# Patient Record
Sex: Male | Born: 1955 | Race: White | Hispanic: No | Smoking: Current every day smoker
Health system: Southern US, Community
[De-identification: ages and names within clinical notes are randomized; demographics above are authoritative.]

## PROBLEM LIST (undated history)

## (undated) DIAGNOSIS — R609 Edema, unspecified: Secondary | ICD-10-CM

## (undated) DIAGNOSIS — R600 Localized edema: Secondary | ICD-10-CM

## (undated) DIAGNOSIS — J449 Chronic obstructive pulmonary disease, unspecified: Secondary | ICD-10-CM

## (undated) DIAGNOSIS — I4891 Unspecified atrial fibrillation: Secondary | ICD-10-CM

## (undated) DIAGNOSIS — L03115 Cellulitis of right lower limb: Secondary | ICD-10-CM

## (undated) DIAGNOSIS — I825Z9 Chronic embolism and thrombosis of unspecified deep veins of unspecified distal lower extremity: Secondary | ICD-10-CM

## (undated) DIAGNOSIS — E119 Type 2 diabetes mellitus without complications: Secondary | ICD-10-CM

## (undated) DIAGNOSIS — C801 Malignant (primary) neoplasm, unspecified: Secondary | ICD-10-CM

## (undated) DIAGNOSIS — I219 Acute myocardial infarction, unspecified: Secondary | ICD-10-CM

## (undated) DIAGNOSIS — J45909 Unspecified asthma, uncomplicated: Secondary | ICD-10-CM

## (undated) DIAGNOSIS — I82401 Acute embolism and thrombosis of unspecified deep veins of right lower extremity: Secondary | ICD-10-CM

## (undated) DIAGNOSIS — R319 Hematuria, unspecified: Secondary | ICD-10-CM

## (undated) HISTORY — PX: THROAT SURGERY: SHX803

## (undated) HISTORY — DX: Edema, unspecified: R60.9

## (undated) HISTORY — DX: Hematuria, unspecified: R31.9

## (undated) HISTORY — DX: Chronic obstructive pulmonary disease, unspecified: J44.9

## (undated) HISTORY — PX: LEG SURGERY: SHX1003

## (undated) HISTORY — PX: TONSILLECTOMY: SUR1361

## (undated) HISTORY — DX: Acute embolism and thrombosis of unspecified deep veins of right lower extremity: I82.401

## (undated) HISTORY — DX: Cellulitis of right lower limb: L03.115

## (undated) HISTORY — DX: Localized edema: R60.0

---

## 2004-09-07 ENCOUNTER — Ambulatory Visit: Payer: Self-pay | Admitting: Family Medicine

## 2005-04-10 ENCOUNTER — Ambulatory Visit: Payer: Self-pay | Admitting: Family Medicine

## 2008-09-01 ENCOUNTER — Encounter: Admission: RE | Admit: 2008-09-01 | Discharge: 2008-09-01 | Payer: Self-pay | Admitting: Oral Surgery

## 2011-06-26 DIAGNOSIS — I825Z9 Chronic embolism and thrombosis of unspecified deep veins of unspecified distal lower extremity: Secondary | ICD-10-CM

## 2011-06-26 HISTORY — DX: Chronic embolism and thrombosis of unspecified deep veins of unspecified distal lower extremity: I82.5Z9

## 2012-02-25 ENCOUNTER — Emergency Department (HOSPITAL_COMMUNITY)
Admission: EM | Admit: 2012-02-25 | Discharge: 2012-02-25 | Disposition: A | Discharge status: Home / Self Care | Payer: Medicaid Other | Attending: Emergency Medicine | Admitting: Emergency Medicine

## 2012-02-25 ENCOUNTER — Emergency Department (HOSPITAL_COMMUNITY): Payer: Medicaid Other

## 2012-02-25 ENCOUNTER — Encounter (HOSPITAL_COMMUNITY): Payer: Self-pay | Admitting: Emergency Medicine

## 2012-02-25 DIAGNOSIS — M7989 Other specified soft tissue disorders: Secondary | ICD-10-CM

## 2012-02-25 DIAGNOSIS — J45909 Unspecified asthma, uncomplicated: Secondary | ICD-10-CM | POA: Insufficient documentation

## 2012-02-25 DIAGNOSIS — I252 Old myocardial infarction: Secondary | ICD-10-CM | POA: Insufficient documentation

## 2012-02-25 DIAGNOSIS — I825Y9 Chronic embolism and thrombosis of unspecified deep veins of unspecified proximal lower extremity: Secondary | ICD-10-CM | POA: Insufficient documentation

## 2012-02-25 DIAGNOSIS — H9209 Otalgia, unspecified ear: Secondary | ICD-10-CM | POA: Insufficient documentation

## 2012-02-25 HISTORY — DX: Unspecified asthma, uncomplicated: J45.909

## 2012-02-25 HISTORY — DX: Malignant (primary) neoplasm, unspecified: C80.1

## 2012-02-25 HISTORY — DX: Acute myocardial infarction, unspecified: I21.9

## 2012-02-25 LAB — CBC WITH DIFFERENTIAL/PLATELET
Basophils Absolute: 0.1 10*3/uL (ref 0.0–0.1)
Basophils Relative: 1 % (ref 0–1)
Eosinophils Absolute: 0.2 10*3/uL (ref 0.0–0.7)
Eosinophils Relative: 3 % (ref 0–5)
HCT: 41 % (ref 39.0–52.0)
MCH: 32.2 pg (ref 26.0–34.0)
MCHC: 34.6 g/dL (ref 30.0–36.0)
MCV: 93 fL (ref 78.0–100.0)
Monocytes Absolute: 0.4 10*3/uL (ref 0.1–1.0)
RDW: 14.1 % (ref 11.5–15.5)

## 2012-02-25 LAB — COMPREHENSIVE METABOLIC PANEL
AST: 20 U/L (ref 0–37)
Albumin: 3.7 g/dL (ref 3.5–5.2)
CO2: 26 mEq/L (ref 19–32)
Calcium: 10 mg/dL (ref 8.4–10.5)
Creatinine, Ser: 0.75 mg/dL (ref 0.50–1.35)
GFR calc non Af Amer: >90 mL/min (ref >90)
Total Protein: 7.6 g/dL (ref 6.0–8.3)

## 2012-02-25 LAB — URINE MICROSCOPIC-ADD ON

## 2012-02-25 LAB — URINALYSIS, ROUTINE W REFLEX MICROSCOPIC
Glucose, UA: NEGATIVE mg/dL
Leukocytes, UA: NEGATIVE
Protein, ur: NEGATIVE mg/dL

## 2012-02-25 MED ORDER — IOHEXOL 350 MG/ML SOLN
100.0000 mL | Freq: Once | INTRAVENOUS | Status: AC | PRN
  Administered 2012-02-25: 100 mL via INTRAVENOUS

## 2012-02-25 NOTE — ED Notes (Signed)
Patient c/o bilaterally swelling in feet x1 week. Patient also c/o drainage coming from left ear. Reports drainage being yellow in color with foul smell.

## 2012-02-25 NOTE — ED Provider Notes (Signed)
History  This chart was scribed for No att. providers found by Shari Heritage. The patient was seen in room APA05/APA05. Patient's care was started at 0726.     CSN: [id number]  Arrival date & time 02/25/12  0708   First MD Initiated Contact with Patient 02/25/12 [phone]      Chief Complaint  Patient presents with  . Leg Swelling  . Otalgia    Patient is a 56 y.o. male presenting with leg pain. The history is provided by the patient. No language interpreter was used.  Leg Pain  The incident occurred more than 2 days ago. The incident occurred at home. There was no injury mechanism. The pain is present in the left leg and right leg. The pain has been constant since onset. It is unknown if a foreign body is present.    Keith Kelly is a 56 y.o. male who presents to the Emergency Department complaining bilateral swelling of his lower extremities onset 1 week ago. There is associated moderate, constant bilateral leg pain. He denies any obvious injury or fall. Patient states that he experiences leg swelling every year for about a month that resolves on its own, but his current pain prompted him to seek evaluation today. Patient denies abdominal pain, nausea, vomiting, fever or cough.   Patient is also complaining of left-sided otalgia. There is associated yellow drainage that is malodorous. Patient denies any right ear pain. Patient has a history of throat cancer, MI and asthma. Patient denies history of heart failure or kidney or liver problems. He is a current everyday smoker.   PCP - [place]  Past Medical History  Diagnosis Date  . Asthma   . Cancer   . MI (myocardial infarction)     Past Surgical History  Procedure Date  . Tonsillectomy   . Leg surgery   . Throat surgery     History reviewed. No pertinent family history.  History  Substance Use Topics  . Smoking status: Current Everyday Smoker -- 1.0 packs/day for 30 years    Types: Cigarettes  . Smokeless tobacco: Never  Used  . Alcohol Use: No      Review of Systems  HENT: Positive for ear pain and ear discharge.   Respiratory: Negative for cough.   Cardiovascular: Positive for leg swelling.  Gastrointestinal: Negative for nausea, vomiting and abdominal pain.  All other systems reviewed and are negative.    Allergies  Penicillins  Home Medications   Current Outpatient Rx  Name Route Sig Dispense Refill  . ASPIRIN EC 325 MG PO TBEC Oral Take 325 mg by mouth daily.    . BUDESONIDE-FORMOTEROL FUMARATE 160-4.5 MCG/ACT IN AERO Inhalation Inhale 2 puffs into the lungs 2 (two) times daily.      BP 122/70  Pulse 62  Temp 97.9 F (36.6 C) (Oral)  Resp 20  Ht 6\' 3"  (1.905 m)  Wt 280 lb (127.007 kg)  BMI 35.00 kg/m2  SpO2 95%  Physical Exam  Constitutional: He is oriented to person, place, and time. He appears well-developed and well-nourished.  HENT:  Head: Normocephalic and atraumatic.       Moderate cerumen bilaterally, left TM appears normal. No tragus or mastoid pain.  Eyes: EOM are normal. Pupils are equal, round, and reactive to light.  Cardiovascular: Normal rate and regular rhythm.   Pulses:      Dorsalis pedis pulses are 2+ on the right side, and 2+ on the left side.  Pulmonary/Chest: Effort normal. No  respiratory distress.  Musculoskeletal: Normal range of motion. He exhibits edema (+3 pitting edema to mid tibia bilaterally).       Chronic venous stasis changes of the bilateral shins. +2 DP pulses. Ankle plantar dorsiflexion intact. Distal sensation intact. No palpable cords.  Neurological: He is alert and oriented to person, place, and time.  Skin: Skin is warm and dry.  Psychiatric: He has a normal mood and affect. His behavior is normal.    ED Course  Procedures (including critical care time) DIAGNOSTIC STUDIES: Oxygen Saturation is 98% on room air, normal by my interpretation.    COORDINATION OF CARE: 7:39am- Patient informed of current plan for treatment and evaluation  and agrees with plan at this time. Ordered US of bilateral lower extremities, microscopic urine test, CBC, comp met pnael, BNP test. Also ordered cerumen removal.  9:20am- Patient is stable. Waiting on [place].  11:25am- Advised patient on [place] results, no blood clots visible to lungs.   Labs Reviewed  COMPREHENSIVE METABOLIC PANEL - Abnormal; Notable for the following:    Sodium 132 (*)     Glucose, Bld 111 (*)     Total Bilirubin 0.2 (*)     All other components within normal limits  URINALYSIS, ROUTINE W REFLEX MICROSCOPIC - Abnormal; Notable for the following:    Hgb urine dipstick TRACE (*)     All other components within normal limits  CBC WITH DIFFERENTIAL  PRO B NATRIURETIC PEPTIDE  URINE MICROSCOPIC-ADD ON    Ct Angio Chest W/cm &/or Wo Cm  02/25/2012  *RADIOLOGY REPORT*  Clinical Data: Chronic deep venous thrombosis.  Pulmonary embolism. Leg swelling.  CT ANGIOGRAPHY CHEST  Technique:  Multidetector CT imaging of the chest using the standard protocol during bolus administration of intravenous contrast. Multiplanar reconstructed images including MIPs were obtained and reviewed to evaluate the vascular anatomy.  Contrast: OMNIPAQUE IOHEXOL 350 MG/ML SOLN  Comparison: 05/26/2005 chest CT.  Findings: Technically adequate study.  Negative for pulmonary embolus.  There is there is a linear area of low attenuation in the right lower lobe pulmonary artery (image number 59) which may be artifactual due to contrast within the venous system or represent old pulmonary embolus with web.  Chronic enlargement of the left pulmonary artery, similar to 2006. Incidental imaging the upper abdomen demonstrates a partially visualized 24 mm right adrenal nodule.  Although only partially visualized, this probably represents adrenal adenoma which was seen on the prior exam.  Mild aortic atherosclerosis.  Three-vessel aortic arch.  Stable scattered peripheral pulmonary nodules measuring 4 mm or less.  Central  airways are patent.  No aggressive osseous lesions.  IMPRESSION: 1.  Negative for pulmonary embolus. 2.  Partially visualized right adrenal nodule, probably adenoma. 3.  Stable benign scattered 4 mm or less pulmonary nodules.   Original Report Authenticated By: Andreas Newport, M.D.    US Venous Img Lower Bilateral  02/25/2012  *RADIOLOGY REPORT*  Clinical Data: Bilateral lower extremity edema and pain.  History of DVT.  VENOUS DUPLEX ULTRASOUND OF BILATERAL LOWER EXTREMITIES  Technique:  Gray-scale sonography with graded compression, as well as color Doppler and duplex ultrasound, were performed to evaluate the deep venous system of both lower extremities from the level of the common femoral vein through the popliteal and proximal calf veins.  Spectral Doppler was utilized to evaluate flow at rest and with distal augmentation maneuvers.  Comparison:  Recent study performed at [facility] on 02/23/2012.  Findings: Similar to the recent study  performed at [facility], there is evidence of nonocclusive mixed density mural thrombus in the distal femoral vein and popliteal veins of the right lower extremity with appearance suggestive of chronic thrombus.  No evidence of progression since the prior ultrasound and no evidence of occlusive DVT.  Similar nonocclusive thrombus is noted in a proximal left posterior tibial vein.  There is a small amount of nonocclusive thrombus in the distal thigh at the level of the distal left femoral vein. This appears likely chronic.  IMPRESSION: Similar findings to the recent [facility] study demonstrating evidence of probable chronic DVT in both lower extremities.  No evidence of significant propagation of thrombus since the prior study.   Original Report Authenticated By: Reola Calkins, M.D.      1. Leg swelling       MDM  Bilateral atraumatic lower extremity edema for the past week intermittent for the past one year. No chest pain or shortness  of breath.  Normal renal and hepatic function. BNP normal.  Venous Doppler showed bilateral chronic DVTs.  Patient now remembers that he had his ultrasound done several days ago at [facility].  He denies any chest pain or shortness of breath. However given his chronic DVTs, we'll evaluate for pulmonary embolism.  CT negative for pulmonary embolism.  No indication for anticoagulation in absence of acute thromboembolism.   I personally performed the services described in this documentation, which was scribed in my presence.  The recorded information has been reviewed and considered.    Glynn Octave, MD 02/25/12 1730

## 2012-02-25 NOTE — ED Notes (Signed)
Patient with no complaints at this time. Respirations even and unlabored. Skin warm/dry. Discharge instructions reviewed with patient at this time. Patient given opportunity to voice concerns/ask questions. Patient discharged at this time and left Emergency Department with steady gait.   

## 2014-04-04 ENCOUNTER — Emergency Department (HOSPITAL_COMMUNITY)
Admission: EM | Admit: 2014-04-04 | Discharge: 2014-04-04 | Disposition: A | Discharge status: Home / Self Care | Payer: Medicaid Other | Attending: Emergency Medicine | Admitting: Emergency Medicine

## 2014-04-04 ENCOUNTER — Encounter (HOSPITAL_COMMUNITY): Payer: Self-pay | Admitting: Emergency Medicine

## 2014-04-04 DIAGNOSIS — Z86718 Personal history of other venous thrombosis and embolism: Secondary | ICD-10-CM | POA: Diagnosis not present

## 2014-04-04 DIAGNOSIS — Z9889 Other specified postprocedural states: Secondary | ICD-10-CM | POA: Diagnosis not present

## 2014-04-04 DIAGNOSIS — I252 Old myocardial infarction: Secondary | ICD-10-CM | POA: Insufficient documentation

## 2014-04-04 DIAGNOSIS — Z859 Personal history of malignant neoplasm, unspecified: Secondary | ICD-10-CM | POA: Insufficient documentation

## 2014-04-04 DIAGNOSIS — Z79899 Other long term (current) drug therapy: Secondary | ICD-10-CM | POA: Insufficient documentation

## 2014-04-04 DIAGNOSIS — J45909 Unspecified asthma, uncomplicated: Secondary | ICD-10-CM | POA: Diagnosis not present

## 2014-04-04 DIAGNOSIS — M7989 Other specified soft tissue disorders: Secondary | ICD-10-CM | POA: Diagnosis present

## 2014-04-04 DIAGNOSIS — Z880 Allergy status to penicillin: Secondary | ICD-10-CM | POA: Insufficient documentation

## 2014-04-04 DIAGNOSIS — R600 Localized edema: Secondary | ICD-10-CM | POA: Diagnosis not present

## 2014-04-04 DIAGNOSIS — Z720 Tobacco use: Secondary | ICD-10-CM | POA: Diagnosis not present

## 2014-04-04 DIAGNOSIS — Z7982 Long term (current) use of aspirin: Secondary | ICD-10-CM | POA: Insufficient documentation

## 2014-04-04 HISTORY — DX: Chronic embolism and thrombosis of unspecified deep veins of unspecified distal lower extremity: I82.5Z9

## 2014-04-04 MED ORDER — ENOXAPARIN SODIUM 120 MG/0.8ML ~~LOC~~ SOLN
1.0000 mg/kg | Freq: Once | SUBCUTANEOUS | Status: AC
  Administered 2014-04-04: 105 mg via SUBCUTANEOUS
  Filled 2014-04-04: qty 0.8

## 2014-04-04 NOTE — Discharge Instructions (Signed)
Return tomorrow at the given time for an ultrasound of your leg to rule out a blood clot.  Return to the emergency department if you develop chest pain or difficulty breathing.   Edema Edema is an abnormal buildup of fluids in your bodytissues. Edema is somewhatdependent on gravity to pull the fluid to the lowest place in your body. That makes the condition more common in the legs and thighs (lower extremities). Painless swelling of the feet and ankles is common and becomes more likely as you get older. It is also common in looser tissues, like around your eyes.  When the affected area is squeezed, the fluid may move out of that spot and leave a dent for a few moments. This dent is called pitting.  CAUSES  There are many possible causes of edema. Eating too much salt and being on your feet or sitting for a long time can cause edema in your legs and ankles. Hot weather may make edema worse. Common medical causes of edema include:  Heart failure.  Liver disease.  Kidney disease.  Weak blood vessels in your legs.  Cancer.  An injury.  Pregnancy.  Some medications.  Obesity. SYMPTOMS  Edema is usually painless.Your skin may look swollen or shiny.  DIAGNOSIS  Your health care provider may be able to diagnose edema by asking about your medical history and doing a physical exam. You may need to have tests such as X-rays, an electrocardiogram, or blood tests to check for medical conditions that may cause edema.  TREATMENT  Edema treatment depends on the cause. If you have heart, liver, or kidney disease, you need the treatment appropriate for these conditions. General treatment may include:  Elevation of the affected body part above the level of your heart.  Compression of the affected body part. Pressure from elastic bandages or support stockings squeezes the tissues and forces fluid back into the blood vessels. This keeps fluid from entering the tissues.  Restriction of fluid and  salt intake.  Use of a water pill (diuretic). These medications are appropriate only for some types of edema. They pull fluid out of your body and make you urinate more often. This gets rid of fluid and reduces swelling, but diuretics can have side effects. Only use diuretics as directed by your health care provider. HOME CARE INSTRUCTIONS   Keep the affected body part above the level of your heart when you are lying down.   Do not sit still or stand for prolonged periods.   Do not put anything directly under your knees when lying down.  Do not wear constricting clothing or garters on your upper legs.   Exercise your legs to work the fluid back into your blood vessels. This may help the swelling go down.   Wear elastic bandages or support stockings to reduce ankle swelling as directed by your health care provider.   Eat a low-salt diet to reduce fluid if your health care provider recommends it.   Only take medicines as directed by your health care provider. SEEK MEDICAL CARE IF:   Your edema is not responding to treatment.  You have heart, liver, or kidney disease and notice symptoms of edema.  You have edema in your legs that does not improve after elevating them.   You have sudden and unexplained weight gain. SEEK IMMEDIATE MEDICAL CARE IF:   You develop shortness of breath or chest pain.   You cannot breathe when you lie down.  You develop pain,  redness, or warmth in the swollen areas.   You have heart, liver, or kidney disease and suddenly get edema.  You have a fever and your symptoms suddenly get worse. MAKE SURE YOU:   Understand these instructions.  Will watch your condition.  Will get help right away if you are not doing well or get worse. Document Released: 06/11/2005 Document Revised: 10/26/2013 Document Reviewed: 04/03/2013 [facility] Patient Information 2015 [place], [place]. This information is not intended to replace advice given to you by your  health care provider. Make sure you discuss any questions you have with your health care provider.

## 2014-04-04 NOTE — ED Provider Notes (Signed)
CSN: [id number]     Arrival date & time 04/04/14  1429 History  This chart was scribed for Veryl Speak, MD by Zola Button, ED Scribe. This patient was seen in room APA12/APA12 and the patient's care was started at 3:18 PM.     Chief Complaint  Patient presents with  . Leg Swelling      Patient is a 58 y.o. male presenting with leg pain. The history is provided by the patient. No language interpreter was used.  Leg Pain Location:  Leg Time since incident:  1 day Injury: no   Leg location:  R leg Pain details:    Onset quality:  Gradual   Duration:  1 day Dislocation: no   Foreign body present:  No foreign bodies Worsened by:  Activity Associated symptoms: swelling   Associated symptoms: no fever    HPI Comments: MONTAY VANVOORHIS is a 58 y.o. male who presents to the Emergency Department complaining of right leg swelling that began yesterday. He notes associated pain towards the back of his right calf. Patient has experienced difficulty ambulating due to his symptoms. Patient denies similar episodes previously. He deneis CP, SOB, and injury.  Patient has no hx of DM. He is currently taking fluid pill, potassium and pain medication.  PCP: Lars Mage (?)    Past Medical History  Diagnosis Date  . Asthma   . Cancer   . MI (myocardial infarction)   . Lower leg DVT (deep venous thromboembolism), chronic 2013    chronic   Past Surgical History  Procedure Laterality Date  . Tonsillectomy    . Leg surgery    . Throat surgery     History reviewed. No pertinent family history. History  Substance Use Topics  . Smoking status: Current Every Day Smoker -- 0.50 packs/day for 30 years    Types: Cigarettes  . Smokeless tobacco: Never Used  . Alcohol Use: No    Review of Systems  Constitutional: Negative for fever and chills.  Musculoskeletal: Positive for arthralgias and joint swelling.  All other systems reviewed and are negative.     Allergies   Penicillins  Home Medications   Prior to Admission medications   Medication Sig Start Date End Date Taking? Authorizing Provider  aspirin EC 325 MG tablet Take 325 mg by mouth daily.    Historical Provider, MD  budesonide-formoterol (SYMBICORT) 160-4.5 MCG/ACT inhaler Inhale 2 puffs into the lungs 2 (two) times daily.    Historical Provider, MD   BP 137/68  Pulse 96  Temp(Src) 97.8 F (36.6 C) (Oral)  Resp 20  Ht 6\' 3"  (1.905 m)  Wt 236 lb (107.049 kg)  BMI 29.50 kg/m2  SpO2 96% Physical Exam  Nursing note and vitals reviewed. Constitutional: He is oriented to person, place, and time. He appears well-developed and well-nourished. No distress.  HENT:  Head: Normocephalic and atraumatic.  Mouth/Throat: Oropharynx is clear and moist. No oropharyngeal exudate.  Eyes: Pupils are equal, round, and reactive to light.  Neck: Neck supple.  Cardiovascular: Normal rate.   Pulmonary/Chest: Effort normal.  Musculoskeletal: He exhibits edema.  Right lower leg is noted to have significant swelling out of proportion to the left leg. There is tenderness in the right calf, however homan sign is absent.  Neurological: He is alert and oriented to person, place, and time. No cranial nerve deficit.  Skin: Skin is warm and dry. No rash noted.  Psychiatric: He has a normal mood and affect. His behavior is  normal.    ED Course  Procedures  DIAGNOSTIC STUDIES: Oxygen Saturation is 96% on RA, adequate by my interpretation.    COORDINATION OF CARE: 3:21 PM-Discussed treatment plan which includes medication with pt at bedside and pt agreed to plan.   Labs Review Labs Reviewed - No data to display  Imaging Review No results found.   EKG Interpretation None      MDM   Final diagnoses:  None    Patient presents with complaints of swelling and pain in his right lower extremity concerning for a DVT. He has no history of this and is not taking any blood thinners with the exception of  aspirin. He was given a subcutaneous injection of Lovenox and will return tomorrow for an ultrasound to rule out DVT.  I personally performed the services described in this documentation, which was scribed in my presence. The recorded information has been reviewed and is accurate.      Veryl Speak, MD 04/05/14 0001

## 2014-04-04 NOTE — ED Notes (Signed)
Pt reports leg swelling RLE, increased urinary frequency since this am. Pt alert and oriented. Limited ROM noted in RLE. Pt denies any SOB,CP. Equal warmth noted in BLE. Moderate Edema noted to RLE.

## 2014-04-05 ENCOUNTER — Other Ambulatory Visit (HOSPITAL_COMMUNITY): Payer: Self-pay | Admitting: Emergency Medicine

## 2014-04-05 DIAGNOSIS — R609 Edema, unspecified: Secondary | ICD-10-CM

## 2014-04-06 ENCOUNTER — Ambulatory Visit (HOSPITAL_COMMUNITY)
Admission: RE | Admit: 2014-04-06 | Discharge: 2014-04-06 | Disposition: A | Discharge status: Home / Self Care | Payer: Medicaid Other | Source: Ambulatory Visit | Attending: Emergency Medicine | Admitting: Emergency Medicine

## 2014-04-06 DIAGNOSIS — Z86718 Personal history of other venous thrombosis and embolism: Secondary | ICD-10-CM | POA: Insufficient documentation

## 2014-04-06 DIAGNOSIS — R609 Edema, unspecified: Secondary | ICD-10-CM | POA: Diagnosis present

## 2014-04-06 DIAGNOSIS — M79604 Pain in right leg: Secondary | ICD-10-CM | POA: Insufficient documentation

## 2014-04-10 NOTE — ED Provider Notes (Signed)
Ultrasound right lower extremity reveals a DVT.  No chest pain or shortness of breath. Discussed with patient and his wife. Will start Xarelto. Patient has primary care followup.  Nat Christen, MD 04/10/14 1723

## 2014-05-06 ENCOUNTER — Ambulatory Visit: Payer: Medicaid Other | Admitting: Cardiovascular Disease

## 2014-05-06 ENCOUNTER — Ambulatory Visit (INDEPENDENT_AMBULATORY_CARE_PROVIDER_SITE_OTHER): Payer: Medicaid Other | Admitting: Cardiovascular Disease

## 2014-05-06 ENCOUNTER — Encounter: Payer: Self-pay | Admitting: Cardiovascular Disease

## 2014-05-06 DIAGNOSIS — C140 Malignant neoplasm of pharynx, unspecified: Secondary | ICD-10-CM | POA: Insufficient documentation

## 2014-05-06 DIAGNOSIS — R0602 Shortness of breath: Secondary | ICD-10-CM

## 2014-05-06 DIAGNOSIS — I82409 Acute embolism and thrombosis of unspecified deep veins of unspecified lower extremity: Secondary | ICD-10-CM | POA: Insufficient documentation

## 2014-05-06 DIAGNOSIS — J449 Chronic obstructive pulmonary disease, unspecified: Secondary | ICD-10-CM

## 2014-05-06 DIAGNOSIS — I517 Cardiomegaly: Secondary | ICD-10-CM | POA: Insufficient documentation

## 2014-05-06 DIAGNOSIS — I82401 Acute embolism and thrombosis of unspecified deep veins of right lower extremity: Secondary | ICD-10-CM

## 2014-05-06 DIAGNOSIS — R9431 Abnormal electrocardiogram [ECG] [EKG]: Secondary | ICD-10-CM | POA: Insufficient documentation

## 2014-05-06 DIAGNOSIS — R600 Localized edema: Secondary | ICD-10-CM

## 2014-05-06 NOTE — Progress Notes (Signed)
[name] Date of Birth  1956-06-18       [place] [address], [address], [place], [place]  [address]   [place],   [id number] [place]   Fax  [phone]     Fax [phone]  Problem List: 1. Throat  Cancer ~ 2010 2. COPD 3. LVH by EKG 4. Right leg DVT - dx 2015   History of Present Illness:  Keith Kelly is seen today for evaluation for abnormal ECG - LVH Hx COPD. He has frequent episodes of shortness of breath. He denies any chest discomfort. Does not do any yard work.  Does not do any exercise.  May walk to his daughter's house several times a day.  No CP with walking.   Still smokes  Hx of throat cancer.  Has had tonsils and part of his lip removed.   He developed pain and swelling in his right leg and was found to have a DVT about a month ago.   Does not follow any special diet.  Does his own cooking-   Current Outpatient Prescriptions on File Prior to Visit  Medication Sig Dispense Refill  . budesonide-formoterol (SYMBICORT) 160-4.5 MCG/ACT inhaler Inhale 2 puffs into the lungs 2 (two) times daily.    Marland Kitchen etodolac (LODINE) 400 MG tablet Take 400 mg by mouth daily as needed for mild pain or moderate pain.    . furosemide (LASIX) 40 MG tablet Take 40 mg by mouth daily.    Marland Kitchen HYDROcodone-acetaminophen (NORCO/VICODIN) 5-325 MG per tablet Take 1-2 tablets by mouth every 6 (six) hours as needed for moderate pain.    . potassium chloride (K-DUR) 10 MEQ tablet Take 10 mEq by mouth daily.     No current facility-administered medications on file prior to visit.    Allergies  Allergen Reactions  . Meloxicam Other (See Comments)    UNKNOWN  . Penicillins Hives    Past Medical History  Diagnosis Date  . Asthma   . Cancer   . MI (myocardial infarction)   . Lower leg DVT (deep venous thromboembolism), chronic 2013    chronic  . Cellulitis of right lower extremity   . Blood  in urine   . Right leg DVT   . Peripheral edema   . COPD (chronic obstructive pulmonary disease)     Past Surgical History  Procedure Laterality Date  . Tonsillectomy    . Leg surgery    . Throat surgery      History  Smoking status  . Current Every Day Smoker -- 0.50 packs/day for 30 years  . Types: Cigarettes  Smokeless tobacco  . Never Used    History  Alcohol Use No    Family History  Problem Relation Age of Onset  . COPD Father   . Deep vein thrombosis Mother     Reviw of Systems:  Reviewed in the HPI.  All other systems are negative.  Physical Exam: Blood pressure 116/82, pulse 82, height 6\' 3"  (1.905 m), weight 279 lb (126.554 kg). Wt Readings from Last 3 Encounters:  05/06/14 279 lb (126.554 kg)  04/04/14 236 lb (107.049 kg)  02/25/12 280 lb (127.007 kg)     General: Well developed, well nourished, in no acute distress.  Head: Normocephalic, s/p left face surgery sclera non-icteric, mucus membranes are moist,   Neck: Supple. Carotids are 2 + without bruits. No  JVD  Lungs: Clear   Heart: RR, normal s1s2  Abdomen: Soft, non-tender, obese   Msk:  Strength and tone are normal   Extremities: No clubbing or cyanosis. 2+ leg edema.   Chronic stasis changes   Neuro: CN II - XII intact.  Alert and oriented X 3.   Psych:  Flat affect.   ECG: 05/06/2014: Normal sinus rhythm at 82. He has left axis deviation. There is minimal voltage criteria for left ventricular hypertrophy.  Assessment / Plan:

## 2014-05-06 NOTE — Patient Instructions (Addendum)
  I recommend that you elevate your legs .   Go to [email or web address]      REDUCE HIGH SODIUM FOODS LIKE CANNED SOUP, GRAVY, SAUCES, READY PREPARED FOODS LIKE FROZEN FOODS; LEAN CUISINE, LASAGNA. BACON, SAUSAGE, LUNCH MEAT, FAST FOODS, HOT DOGS, CHIPS, PIZZA.   Your physician has requested that you have an echocardiogram. Echocardiography is a painless test that uses sound waves to create images of your heart. It provides your doctor with information about the size and shape of your heart and how well your heart's chambers and valves are working. This procedure takes approximately one hour. There are no restrictions for this procedure.  Your physician recommends that you continue on your current medications as directed. Please refer to the Current Medication list given to you today.   Your physician wants you to follow-up in: 6 months with Dr. Acie Fredrickson.  You will receive a reminder letter in the mail two months in advance. If you don't receive a letter, please call our office to schedule the follow-up appointment.

## 2014-05-06 NOTE — Assessment & Plan Note (Signed)
Continue xarelto Follow up with his medical doctor  Recommended leg elevation

## 2014-05-06 NOTE — Assessment & Plan Note (Signed)
We're asked to see Mr. 6 today for further evaluation of an abnormal EKG. He was noted to have left ventricular hypertrophy on his EKG. He has chronic obstructive pulmonary disease and is chronically short. He denies episodes of chest pain. His blood pressure is normal today.  I would like to get an echocardiogram for further evaluation of his mild leg edema and his LVH by EKG. I'm concerned that he may have some congestive heart failure.  We discussed a low-salt diet. Discussed the importance of leg elevation. I've given him a website for the lounge Dr. Leg rest. I will see him  back in 6 months. If he seems to be stable then we will refer him back to his primary medical doctor.  At this point he does not have any signs or symptoms of coronary ischemia. I did recommend that he stop smoking completely. He has a history of throat cancer

## 2014-05-12 ENCOUNTER — Other Ambulatory Visit (HOSPITAL_COMMUNITY): Payer: Medicaid Other

## 2015-04-01 ENCOUNTER — Other Ambulatory Visit (HOSPITAL_COMMUNITY): Payer: Self-pay | Admitting: Respiratory Therapy

## 2015-04-01 DIAGNOSIS — G4730 Sleep apnea, unspecified: Secondary | ICD-10-CM

## 2016-12-04 ENCOUNTER — Encounter (HOSPITAL_COMMUNITY): Payer: Self-pay

## 2016-12-04 ENCOUNTER — Emergency Department (HOSPITAL_COMMUNITY): Payer: Medicaid Other

## 2016-12-04 ENCOUNTER — Inpatient Hospital Stay (HOSPITAL_COMMUNITY)
Admission: EM | Admit: 2016-12-04 | Discharge: 2016-12-05 | DRG: 292 | Discharge status: Against Medical Advice | Payer: Medicaid Other | Attending: Family Medicine | Admitting: Family Medicine

## 2016-12-04 DIAGNOSIS — E1165 Type 2 diabetes mellitus with hyperglycemia: Secondary | ICD-10-CM | POA: Diagnosis present

## 2016-12-04 DIAGNOSIS — Z85819 Personal history of malignant neoplasm of unspecified site of lip, oral cavity, and pharynx: Secondary | ICD-10-CM | POA: Diagnosis not present

## 2016-12-04 DIAGNOSIS — H9190 Unspecified hearing loss, unspecified ear: Secondary | ICD-10-CM | POA: Diagnosis present

## 2016-12-04 DIAGNOSIS — F17210 Nicotine dependence, cigarettes, uncomplicated: Secondary | ICD-10-CM | POA: Diagnosis present

## 2016-12-04 DIAGNOSIS — I252 Old myocardial infarction: Secondary | ICD-10-CM | POA: Diagnosis not present

## 2016-12-04 DIAGNOSIS — Z880 Allergy status to penicillin: Secondary | ICD-10-CM

## 2016-12-04 DIAGNOSIS — J449 Chronic obstructive pulmonary disease, unspecified: Secondary | ICD-10-CM | POA: Diagnosis present

## 2016-12-04 DIAGNOSIS — L03115 Cellulitis of right lower limb: Secondary | ICD-10-CM | POA: Diagnosis present

## 2016-12-04 DIAGNOSIS — L8990 Pressure ulcer of unspecified site, unspecified stage: Secondary | ICD-10-CM | POA: Insufficient documentation

## 2016-12-04 DIAGNOSIS — I4891 Unspecified atrial fibrillation: Secondary | ICD-10-CM | POA: Diagnosis present

## 2016-12-04 DIAGNOSIS — Z888 Allergy status to other drugs, medicaments and biological substances status: Secondary | ICD-10-CM | POA: Diagnosis not present

## 2016-12-04 DIAGNOSIS — Z5321 Procedure and treatment not carried out due to patient leaving prior to being seen by health care provider: Secondary | ICD-10-CM | POA: Diagnosis not present

## 2016-12-04 DIAGNOSIS — L03116 Cellulitis of left lower limb: Secondary | ICD-10-CM | POA: Diagnosis present

## 2016-12-04 DIAGNOSIS — Z6841 Body Mass Index (BMI) 40.0 and over, adult: Secondary | ICD-10-CM | POA: Diagnosis not present

## 2016-12-04 DIAGNOSIS — L03311 Cellulitis of abdominal wall: Secondary | ICD-10-CM | POA: Diagnosis present

## 2016-12-04 DIAGNOSIS — E119 Type 2 diabetes mellitus without complications: Secondary | ICD-10-CM

## 2016-12-04 DIAGNOSIS — Z7984 Long term (current) use of oral hypoglycemic drugs: Secondary | ICD-10-CM | POA: Diagnosis not present

## 2016-12-04 DIAGNOSIS — I82431 Acute embolism and thrombosis of right popliteal vein: Secondary | ICD-10-CM | POA: Diagnosis present

## 2016-12-04 DIAGNOSIS — I509 Heart failure, unspecified: Secondary | ICD-10-CM

## 2016-12-04 DIAGNOSIS — Z79899 Other long term (current) drug therapy: Secondary | ICD-10-CM

## 2016-12-04 DIAGNOSIS — Z7901 Long term (current) use of anticoagulants: Secondary | ICD-10-CM | POA: Diagnosis not present

## 2016-12-04 DIAGNOSIS — D509 Iron deficiency anemia, unspecified: Secondary | ICD-10-CM | POA: Diagnosis present

## 2016-12-04 DIAGNOSIS — E118 Type 2 diabetes mellitus with unspecified complications: Secondary | ICD-10-CM | POA: Diagnosis not present

## 2016-12-04 DIAGNOSIS — I5023 Acute on chronic systolic (congestive) heart failure: Secondary | ICD-10-CM | POA: Diagnosis not present

## 2016-12-04 DIAGNOSIS — I82409 Acute embolism and thrombosis of unspecified deep veins of unspecified lower extremity: Secondary | ICD-10-CM

## 2016-12-04 DIAGNOSIS — Z86718 Personal history of other venous thrombosis and embolism: Secondary | ICD-10-CM

## 2016-12-04 HISTORY — DX: Unspecified atrial fibrillation: I48.91

## 2016-12-04 HISTORY — DX: Type 2 diabetes mellitus without complications: E11.9

## 2016-12-04 LAB — CBC WITH DIFFERENTIAL/PLATELET
Basophils Absolute: 0.1 10*3/uL (ref 0.0–0.1)
Basophils Relative: 1 %
EOS PCT: 1 %
Eosinophils Absolute: 0.1 10*3/uL (ref 0.0–0.7)
HEMATOCRIT: 39 % (ref 39.0–52.0)
Hemoglobin: 11.6 g/dL — ABNORMAL LOW (ref 13.0–17.0)
Lymphocytes Relative: 15 %
Lymphs Abs: 1.3 10*3/uL (ref 0.7–4.0)
MCH: 23 pg — ABNORMAL LOW (ref 26.0–34.0)
MCHC: 29.7 g/dL — AB (ref 30.0–36.0)
MCV: 77.4 fL — ABNORMAL LOW (ref 78.0–100.0)
MONO ABS: 0.8 10*3/uL (ref 0.1–1.0)
MONOS PCT: 9 %
NEUTROS ABS: 6.6 10*3/uL (ref 1.7–7.7)
Neutrophils Relative %: 74 %
PLATELETS: 218 10*3/uL (ref 150–400)
RBC: 5.04 MIL/uL (ref 4.22–5.81)
RDW: 18.1 % — AB (ref 11.5–15.5)
WBC: 8.9 10*3/uL (ref 4.0–10.5)

## 2016-12-04 LAB — I-STAT CG4 LACTIC ACID, ED: Lactic Acid, Venous: 1.98 mmol/L (ref 0.5–1.9)

## 2016-12-04 LAB — URINALYSIS, ROUTINE W REFLEX MICROSCOPIC
Bilirubin Urine: NEGATIVE
Glucose, UA: NEGATIVE mg/dL
Hgb urine dipstick: NEGATIVE
KETONES UR: NEGATIVE mg/dL
Leukocytes, UA: NEGATIVE
NITRITE: NEGATIVE
Protein, ur: NEGATIVE mg/dL
SPECIFIC GRAVITY, URINE: 1.003 — AB (ref 1.005–1.030)
pH: 5 (ref 5.0–8.0)

## 2016-12-04 LAB — COMPREHENSIVE METABOLIC PANEL
ALBUMIN: 3.2 g/dL — AB (ref 3.5–5.0)
ALK PHOS: 123 U/L (ref 38–126)
ALT: 12 U/L — AB (ref 17–63)
AST: 17 U/L (ref 15–41)
Anion gap: 9 (ref 5–15)
BUN: 17 mg/dL (ref 6–20)
CO2: 29 mmol/L (ref 22–32)
CREATININE: 0.84 mg/dL (ref 0.61–1.24)
Calcium: 8.6 mg/dL — ABNORMAL LOW (ref 8.9–10.3)
Chloride: 95 mmol/L — ABNORMAL LOW (ref 101–111)
GFR calc Af Amer: >60 mL/min (ref >60)
GFR calc non Af Amer: >60 mL/min (ref >60)
GLUCOSE: 158 mg/dL — AB (ref 65–99)
Potassium: 4.3 mmol/L (ref 3.5–5.1)
SODIUM: 133 mmol/L — AB (ref 135–145)
Total Bilirubin: 0.7 mg/dL (ref 0.3–1.2)
Total Protein: 7.8 g/dL (ref 6.5–8.1)

## 2016-12-04 LAB — GLUCOSE, CAPILLARY: Glucose-Capillary: 135 mg/dL — ABNORMAL HIGH (ref 65–99)

## 2016-12-04 LAB — BRAIN NATRIURETIC PEPTIDE: B NATRIURETIC PEPTIDE 5: 345 pg/mL — AB (ref 0.0–100.0)

## 2016-12-04 MED ORDER — VANCOMYCIN HCL IN DEXTROSE 1-5 GM/200ML-% IV SOLN: 1000.0000 mg | Freq: Once | INTRAVENOUS | Status: DC

## 2016-12-04 MED ORDER — MOMETASONE FURO-FORMOTEROL FUM 200-5 MCG/ACT IN AERO
2.0000 | INHALATION_SPRAY | Freq: Two times a day (BID) | RESPIRATORY_TRACT | Status: DC
  Administered 2016-12-04 – 2016-12-05 (×2): 2 via RESPIRATORY_TRACT
  Filled 2016-12-04: qty 8.8

## 2016-12-04 MED ORDER — AZTREONAM 2 G IJ SOLR
2.0000 g | Freq: Once | INTRAMUSCULAR | Status: AC
  Administered 2016-12-04: 2 g via INTRAVENOUS
  Filled 2016-12-04: qty 2

## 2016-12-04 MED ORDER — ATORVASTATIN CALCIUM 20 MG PO TABS
20.0000 mg | ORAL_TABLET | Freq: Every day | ORAL | Status: DC
  Administered 2016-12-04: 20 mg via ORAL
  Filled 2016-12-04 (×2): qty 1

## 2016-12-04 MED ORDER — METRONIDAZOLE IN NACL 5-0.79 MG/ML-% IV SOLN
500.0000 mg | Freq: Once | INTRAVENOUS | Status: AC
  Administered 2016-12-04: 500 mg via INTRAVENOUS
  Filled 2016-12-04: qty 100

## 2016-12-04 MED ORDER — SODIUM CHLORIDE 0.9% FLUSH: 3.0000 mL | INTRAVENOUS | Status: DC | PRN

## 2016-12-04 MED ORDER — INSULIN ASPART 100 UNIT/ML ~~LOC~~ SOLN: 0.0000 [IU] | Freq: Every day | SUBCUTANEOUS | Status: DC

## 2016-12-04 MED ORDER — FUROSEMIDE 10 MG/ML IJ SOLN
40.0000 mg | Freq: Two times a day (BID) | INTRAMUSCULAR | Status: DC
  Administered 2016-12-04 – 2016-12-05 (×2): 40 mg via INTRAVENOUS
  Filled 2016-12-04 (×2): qty 4

## 2016-12-04 MED ORDER — HYDROCODONE-ACETAMINOPHEN 5-325 MG PO TABS
1.0000 | ORAL_TABLET | Freq: Four times a day (QID) | ORAL | Status: DC | PRN
  Administered 2016-12-05: 2 via ORAL
  Administered 2016-12-05: 1 via ORAL
  Filled 2016-12-04 (×2): qty 2

## 2016-12-04 MED ORDER — NICOTINE 14 MG/24HR TD PT24
14.0000 mg | MEDICATED_PATCH | Freq: Every day | TRANSDERMAL | Status: DC
  Administered 2016-12-04 – 2016-12-05 (×2): 14 mg via TRANSDERMAL
  Filled 2016-12-04 (×2): qty 1

## 2016-12-04 MED ORDER — FENTANYL CITRATE (PF) 100 MCG/2ML IJ SOLN
100.0000 ug | Freq: Once | INTRAMUSCULAR | Status: AC
  Administered 2016-12-04: 100 ug via INTRAVENOUS
  Filled 2016-12-04: qty 2

## 2016-12-04 MED ORDER — INSULIN ASPART 100 UNIT/ML ~~LOC~~ SOLN: 0.0000 [IU] | Freq: Three times a day (TID) | SUBCUTANEOUS | Status: DC

## 2016-12-04 MED ORDER — SODIUM CHLORIDE 0.9 % IV SOLN: 250.0000 mL | INTRAVENOUS | Status: DC | PRN

## 2016-12-04 MED ORDER — DILTIAZEM HCL 100 MG IV SOLR
5.0000 mg/h | INTRAVENOUS | Status: DC
  Administered 2016-12-04 – 2016-12-05 (×2): 5 mg/h via INTRAVENOUS
  Filled 2016-12-04 (×2): qty 100

## 2016-12-04 MED ORDER — SODIUM CHLORIDE 0.9% FLUSH
3.0000 mL | Freq: Two times a day (BID) | INTRAVENOUS | Status: DC
  Administered 2016-12-04 – 2016-12-05 (×2): 3 mL via INTRAVENOUS

## 2016-12-04 MED ORDER — PANTOPRAZOLE SODIUM 40 MG PO TBEC
40.0000 mg | DELAYED_RELEASE_TABLET | Freq: Every day | ORAL | Status: DC
  Administered 2016-12-04 – 2016-12-05 (×2): 40 mg via ORAL
  Filled 2016-12-04 (×2): qty 1

## 2016-12-04 MED ORDER — ACETAMINOPHEN 650 MG RE SUPP: 650.0000 mg | Freq: Four times a day (QID) | RECTAL | Status: DC | PRN

## 2016-12-04 MED ORDER — VANCOMYCIN HCL 10 G IV SOLR
2000.0000 mg | Freq: Once | INTRAVENOUS | Status: AC
  Administered 2016-12-04: 2000 mg via INTRAVENOUS
  Filled 2016-12-04: qty 2000

## 2016-12-04 MED ORDER — ACETAMINOPHEN 325 MG PO TABS: 650.0000 mg | ORAL_TABLET | Freq: Four times a day (QID) | ORAL | Status: DC | PRN

## 2016-12-04 NOTE — Consult Note (Signed)
[facility] Consultation Oncology  Name: Keith Kelly      MRN: [id number]    Location: IC07/IC07-01  Date: 12/04/2016 Time:10:46 PM   REFERRING PHYSICIAN:  Murray Hodgkins, MD ([facility])  [place]:  Acute DVT, on Xarelto   DIAGNOSIS:  Occlusive right LE DVT involving right popliteal vein with a H/O right occlusive DVT in October 2015 and 2013, anticoagulation with Xarelto.  HISTORY OF PRESENT ILLNESS:   61 yo man with medical history significant for recurrent right LE DVT (2018, 2015, and 2013) wo is anticoagulated with Xarelto, COPD, H/O throat cancer, a-fib with RVR, CHF, DM type 2, morbid obesity who presents to [facility] ED on 12/04/2016 with complaint of leg swelling.  Patient is a poor historian; further complicated by his hearing issues.  He admits to having a history of blood clots in the past.  He is unable to provide additional information regarding the clinical situation surrounding the diagnosis of his blood clots.    He denies any chest pain or shortness of breath.  He denies any leg pain.  When asked, "how did you know you had an issue with your right leg when you came to the ED?" he responded with: "they told me I had a blood clot."  He denies any recent hospitalizations which is confirmed in his chart and CareEverywhere.  He reports that his last hospitalization at [facility] was "years ago."  He reports that his last surgery, "on my leg" was years ago.  He denies any history of stroke.  He does smoke 1 ppd x years of cigarettes.  He is on Xarelto and reports compliance without any missed or forgotten doses.  He is unable to tell me who his primary care provider is.  He cannot tell me who his cardiologist is.  "Who fills your medications I asked?"  He reports [facility] fills [facility].  He does not know the healthcare provider who gives him prescriptions.  I have reviewed the risk factors for VTE with the patient: H/O immobilization, prolonged  hospitalization, recent surgery/trauma, obesity, previous VTE, malignancy, hormone replacement, stroke, age > 29, family history of VTE, Heart failure, inflammatory bowel disease.  PAST MEDICAL HISTORY:   Past Medical History:  Diagnosis Date  . Asthma   . Atrial fibrillation ([place])   . Blood in urine   . Cancer ([place])   . Cellulitis of right lower extremity   . COPD (chronic obstructive pulmonary disease) ([place])   . Diabetes mellitus ([place])   . Lower leg DVT (deep venous thromboembolism), chronic ([place]) 2013   chronic  . MI (myocardial infarction) ([place])   . Peripheral edema   . Right leg DVT (HCC)     ALLERGIES: Allergies  Allergen Reactions  . Meloxicam Other (See Comments)    Other reaction(s): Unknown UNKNOWN  . Penicillins Hives    Has patient had a PCN reaction causing immediate rash, facial/tongue/throat swelling, SOB or lightheadedness with hypotension: no Has patient had a PCN reaction causing severe rash involving mucus membranes or skin necrosis: no Has patient had a PCN reaction that required hospitalization: no Has patient had a PCN reaction occurring within the last 10 years: no If all of the above answers are "NO", then may proceed with Cephalosporin use.       MEDICATIONS: I have reviewed the patient's current medications.     PAST SURGICAL HISTORY Past Surgical History:  Procedure Laterality Date  . LEG SURGERY    . THROAT SURGERY    .  TONSILLECTOMY      FAMILY HISTORY: Family History  Problem Relation Age of Onset  . COPD Father   . Deep vein thrombosis Mother     SOCIAL HISTORY:  reports that he has been smoking Cigarettes.  He has a 15.00 pack-year smoking history. He has never used smokeless tobacco. He reports that he does not drink alcohol or use drugs.  PERFORMANCE STATUS: The patient's performance status is: unknown  PHYSICAL EXAM: Most Recent Vital Signs: Blood pressure 126/84, pulse 94, temperature 97.6 F (36.4 C), temperature source  Oral, resp. rate (!) 21, height '6\' 3"'$  (1.905 m), weight (!) 347 lb 0.1 oz (157.4 kg), SpO2 96 %. General appearance: alert, cooperative, appears older than stated age, no distress, morbidly obese and hard of hearing, poor historian, no family/friends at the bedside, eating breakfast Head: asymmetric shape, with left facial droop (unknown chronicity). Eyes: negative findings: lids and lashes normal, conjunctivae and sclerae normal and corneas clear Nose: Nares normal. Septum midline. Mucosa normal. No drainage or sinus tenderness. Throat: normal findings: lips normal without lesions and oropharynx pink & moist without lesions or evidence of thrush Lungs: clear to auscultation bilaterally Heart: irregularly irregular rhythm Abdomen: normal findings: bowel sounds normal and soft, non-tender and abnormal findings:  obese Extremities: edema B/L edema with clean dressings B/L on lower half of leg with pedal edema B/L Skin: Skin color, texture, turgor normal. No rashes or lesions Neurologic: Grossly normal  LABORATORY DATA:  Results for orders placed or performed during the hospital encounter of 12/04/16 (from the past 48 hour(s))  Comprehensive metabolic panel     Status: Abnormal   Collection Time: 12/04/16 10:53 AM  Result Value Ref Range   Sodium 133 (L) 135 - 145 mmol/L   Potassium 4.3 3.5 - 5.1 mmol/L   Chloride 95 (L) 101 - 111 mmol/L   CO2 29 22 - 32 mmol/L   Glucose, Bld 158 (H) 65 - 99 mg/dL   BUN 17 6 - 20 mg/dL   Creatinine, Ser 0.84 0.61 - 1.24 mg/dL   Calcium 8.6 (L) 8.9 - 10.3 mg/dL   Total Protein 7.8 6.5 - 8.1 g/dL   Albumin 3.2 (L) 3.5 - 5.0 g/dL   AST 17 15 - 41 U/L   ALT 12 (L) 17 - 63 U/L   Alkaline Phosphatase 123 38 - 126 U/L   Total Bilirubin 0.7 0.3 - 1.2 mg/dL   GFR calc non Af Amer >60 >60 mL/min   GFR calc Af Amer >60 >60 mL/min    Comment: (NOTE) The eGFR has been calculated using the CKD EPI equation. This calculation has not been validated in all clinical  situations. eGFR's persistently <60 mL/min signify possible Chronic Kidney Disease.    Anion gap 9 5 - 15  CBC WITH DIFFERENTIAL     Status: Abnormal   Collection Time: 12/04/16 10:53 AM  Result Value Ref Range   WBC 8.9 4.0 - 10.5 K/uL   RBC 5.04 4.22 - 5.81 MIL/uL   Hemoglobin 11.6 (L) 13.0 - 17.0 g/dL   HCT 39.0 39.0 - 52.0 %   MCV 77.4 (L) 78.0 - 100.0 fL   MCH 23.0 (L) 26.0 - 34.0 pg   MCHC 29.7 (L) 30.0 - 36.0 g/dL   RDW 18.1 (H) 11.5 - 15.5 %   Platelets 218 150 - 400 K/uL   Neutrophils Relative % 74 %   Neutro Abs 6.6 1.7 - 7.7 K/uL   Lymphocytes Relative 15 %  Lymphs Abs 1.3 0.7 - 4.0 K/uL   Monocytes Relative 9 %   Monocytes Absolute 0.8 0.1 - 1.0 K/uL   Eosinophils Relative 1 %   Eosinophils Absolute 0.1 0.0 - 0.7 K/uL   Basophils Relative 1 %   Basophils Absolute 0.1 0.0 - 0.1 K/uL  Brain natriuretic peptide     Status: Abnormal   Collection Time: 12/04/16 10:53 AM  Result Value Ref Range   B Natriuretic Peptide 345.0 (H) 0.0 - 100.0 pg/mL  Urinalysis, Routine w reflex microscopic     Status: Abnormal   Collection Time: 12/04/16 11:15 AM  Result Value Ref Range   Color, Urine COLORLESS (A) YELLOW   APPearance CLEAR CLEAR   Specific Gravity, Urine 1.003 (L) 1.005 - 1.030   pH 5.0 5.0 - 8.0   Glucose, UA NEGATIVE NEGATIVE mg/dL   Hgb urine dipstick NEGATIVE NEGATIVE   Bilirubin Urine NEGATIVE NEGATIVE   Ketones, ur NEGATIVE NEGATIVE mg/dL   Protein, ur NEGATIVE NEGATIVE mg/dL   Nitrite NEGATIVE NEGATIVE   Leukocytes, UA NEGATIVE NEGATIVE  Blood Culture (routine x 2)     Status: None (Preliminary result)   Collection Time: 12/04/16 11:20 AM  Result Value Ref Range   Specimen Description BLOOD LEFT ARM    Special Requests      BOTTLES DRAWN AEROBIC AND ANAEROBIC Blood Culture adequate volume   Culture NO GROWTH < 12 HOURS    Report Status PENDING   I-Stat CG4 Lactic Acid, ED  (not at  [facility])     Status: Abnormal   Collection Time: 12/04/16 11:30 AM   Result Value Ref Range   Lactic Acid, Venous 1.98 (HH) 0.5 - 1.9 mmol/L  Blood Culture (routine x 2)     Status: None (Preliminary result)   Collection Time: 12/04/16 11:42 AM  Result Value Ref Range   Specimen Description BLOOD LEFT ARM    Special Requests      BOTTLES DRAWN AEROBIC AND ANAEROBIC Blood Culture adequate volume   Culture NO GROWTH < 12 HOURS    Report Status PENDING   Glucose, capillary     Status: Abnormal   Collection Time: 12/04/16  9:30 PM  Result Value Ref Range   Glucose-Capillary 135 (H) 65 - 99 mg/dL      RADIOGRAPHY: US Venous Img Lower Bilateral  Result Date: 12/04/2016 CLINICAL DATA:  61 year old with personal history of bilateral lower extremity DVT, most recently involving the right lower extremity in 2015, presenting with bilateral lower extremity pain, edema and erythema progressively worsening over the past 3 weeks. Personal history of lung cancer. Patient currently anticoagulated with Xarelto. EXAM: BILATERAL LOWER EXTREMITY VENOUS DOPPLER ULTRASOUND TECHNIQUE: Gray-scale sonography with graded compression, as well as color Doppler and duplex ultrasound were performed to evaluate the lower extremity deep venous systems from the level of the common femoral vein and including the common femoral, femoral, profunda femoral, popliteal and calf veins including the posterior tibial, peroneal and gastrocnemius veins when visible. The superficial great saphenous vein was also interrogated. Spectral Doppler was utilized to evaluate flow at rest and with distal augmentation maneuvers in the common femoral, femoral and popliteal veins. COMPARISON:  Right lower extremity venous Doppler ultrasound 04/06/2014. Bilateral lower extremity Doppler ultrasound 02/25/2012. FINDINGS: The examination is technically difficult due to the bilateral lower extremity edema. RIGHT LOWER EXTREMITY Common Femoral Vein: No evidence of thrombus. Normal compressibility, respiratory phasicity and  response to augmentation. Saphenofemoral Junction: No evidence of thrombus. Normal compressibility and flow on  color Doppler imaging. Profunda Femoral Vein: No evidence of thrombus. Normal compressibility and flow on color Doppler imaging. Femoral Vein: No evidence of thrombus. Normal compressibility, respiratory phasicity and response to augmentation. Not optimally imaged distally. Popliteal Vein: Occlusive thrombus. No flow on color Doppler imaging. Calf Veins: No color Doppler flow identified in the posterior tibial or peroneal veins in the calf. Superficial Great Saphenous Vein: Not evaluated. Venous Reflux:  Not evaluated. Other Findings:  Diffuse lower extremity edema. LEFT LOWER EXTREMITY Common Femoral Vein: No evidence of thrombus. Normal compressibility, respiratory phasicity and response to augmentation. Saphenofemoral Junction: No evidence of thrombus. Normal compressibility and flow on color Doppler imaging. Profunda Femoral Vein: No evidence of thrombus. Normal compressibility and flow on color Doppler imaging. Femoral Vein: No evidence of thrombus. Normal compressibility, respiratory phasicity and response to augmentation. Popliteal Vein: No evidence of thrombus. Normal compressibility, respiratory phasicity and response to augmentation. Calf Veins: No evidence of thrombus. Normal compressibility and flow on color Doppler imaging. Superficial Great Saphenous Vein: Not evaluated. Venous Reflux:  Not evaluated. Other Findings:  Diffuse lower extremity edema. IMPRESSION: 1. Occlusive thrombus indicating acute DVT involving the right popliteal vein and the right calf veins. 2. No evidence of acute DVT involving the left lower extremity. Electronically Signed   By: Evangeline Dakin M.D.   On: 12/04/2016 14:19       PATHOLOGY:  N/A  ASSESSMENT/PLAN:  Recurrent right lower extremity DVT (2013, 2015, 2018) involving the right popliteal vein with multiple risk factors including morbid obesity,  immobility, heart failure, and history of VTE.  Patient is poor historian and hard of hearing.  The following coag tests are reliable in the setting of anticoagulation while on Xarelto, Pradaxa, or Eliquis:             1. Factor V Leiden             2. Prothombin 20210A             3. Anticardiolipin antibody             4. Anti-beta-glycoprotein-I antibody             5. Protein C+S free and total             6. Antithrombin antigen.  These labs are ordered.  The following coag tests are not reliable in the setting of Xarelto, Pradaxa, or Eliquis:             1. Protein C+S activity (falsely normal)             2. Antithrombin activity (falsely elevated)             3. APC resistance (falsely negative)             4. Lupus anticoagulant tests (unknwon their reliability in the setting of Xarelto, Pradaxa, Eliquis).  CT CAP with contrast to evaluate for occult malignancy given recurrent VTE and history of tobacco abuse, smoking 1 ppd.  Tobacco abuse.  Given new, acute DVT in the setting of compliance with Xarelto, will deem this incident a direct factor Xa inhibitor failure.  In actuality, direct factor Xa inhibitor was a poor anticoagulation choice given the patient's obesity and lack of data on efficacy of direct factor Xa inhibition in obese population.  Based on a 2016 review of available literature, the International Society of Hemostasis and Thrombosis (ISTH) recommends avoidance of these agents in individuals with a body mass index (BMI) >40 kg/m2, or weight ?  120 kg. They recommend use of these agents at standard dose for patients with a BMI ?40 kg/m2.  A 2017 review specific to rivaroxaban concluded that it could be administered to individuals with a BMI >40 kg/m2 (or weight >120 kg) without dose adjustment, although data were limited.  For anticoagulation, recommend LMWH/heparin with transition to Coumadin with a goal INR of 2- 3.5, favoring 2.5-3.5 INR.  Due to weight,  long-term/chronic/lifelong Lovenox injections would not be preferred choice due to high dose of medication would be needed ( 1 mg/kg BID versus 1.5 mg/kg daily).    Microcytic, hypochromic anemia, MILD.  Anemia panel ordered.  Stool card for occult blood also ordered.  Acute CHF- risk factor for VTE.  At time of discharge, patient will need follow-up at [facility] to review test results.  Will recommend PCP/cardiology/coagulation clinic follow and manage the patient's Coumadin dose with regular INRs no greater than every 4 weeks when INRs are stable (preferably every 2 weeks once stable dose is identified for 3 months, followed by no greater than every 4 weeks).  Patient and plan discussed with Dr. Forest Gleason and he is in agreement with the aforementioned.   Robynn Pane, PA-C 12/04/2016 11:27 PM

## 2016-12-04 NOTE — ED Notes (Signed)
Difficulty getting 2nd IV. Getting another nurse to attempt. This is the delay of the Flagyl.

## 2016-12-04 NOTE — ED Notes (Signed)
Unwrapped bandages on patients legs with help of family member.

## 2016-12-04 NOTE — H&P (Signed)
History and Physical  Keith Kelly ZDG:[id number] DOB: 07-Apr-1956 DOA: 12/04/2016  PCP: Dione Housekeeper, MD  Patient coming from: home  Chief Complaint: leg pain  HPI:  61 year old man PMH CHF, COPD, morbid obesity, multiple DVT/PE presented to the emergency department with complaint of leg pain. Found to have massive volume overload bilateral lower extremities with chronic venous stasis changes, atrial fibrillation with rapid ventricular response and referred for admission. Initially there was some concern for sepsis in the emergency department, however the patient does not have any evidence of infection  Patient and fianc at bedside. Patient reports leg edema and pain that has been present for at least a month. He's had pain in both legs without change, without specific aggravating or alleviating factors or associated symptoms. He denies any escalation of swelling or symptoms or any particular reason for coming to the emergency department today other than leg pain. He has been compliant with medication and a low-salt diet.  ED Course: Heart rate 85-140 , 95/68, 90% on room air.  Review of Systems:  Negative for fever, new visual changes, sore throat, muscle aches, rash, chest pain, shortness of breath, dysuria, bleeding, nausea, vomiting, abdominal pain.   Past Medical History:  Diagnosis Date  . Asthma   . Atrial fibrillation ([place])   . Blood in urine   . Cancer ([place])   . Cellulitis of right lower extremity   . COPD (chronic obstructive pulmonary disease) ([place])   . Diabetes mellitus ([place])   . Lower leg DVT (deep venous thromboembolism), chronic ([place]) 2013   chronic  . MI (myocardial infarction) ([place])   . Peripheral edema   . Right leg DVT [facility])     Past Surgical History:  Procedure Laterality Date  . LEG SURGERY    . THROAT SURGERY    . TONSILLECTOMY       reports that he has been smoking Cigarettes.  He has a 15.00 pack-year smoking history. He has never used  smokeless tobacco. He reports that he does not drink alcohol or use drugs.   Allergies  Allergen Reactions  . Meloxicam Other (See Comments)    Other reaction(s): Unknown UNKNOWN  . Penicillins Hives    Has patient had a PCN reaction causing immediate rash, facial/tongue/throat swelling, SOB or lightheadedness with hypotension: no Has patient had a PCN reaction causing severe rash involving mucus membranes or skin necrosis: no Has patient had a PCN reaction that required hospitalization: no Has patient had a PCN reaction occurring within the last 10 years: no If all of the above answers are "NO", then may proceed with Cephalosporin use.     Family History  Problem Relation Age of Onset  . COPD Father   . Deep vein thrombosis Mother      Prior to Admission medications   Medication Sig Start Date End Date Taking? Authorizing Provider  Artificial Tear Ointment (DRY EYES OP) Apply 1 drop to eye daily as needed (dry eyes).   Yes [provider]  atorvastatin (LIPITOR) 20 MG tablet Take 20 mg by mouth daily at 6 PM.   Yes [provider]  budesonide-formoterol (SYMBICORT) 160-4.5 MCG/ACT inhaler Inhale 2 puffs into the lungs 2 (two) times daily.   Yes [provider]  furosemide (LASIX) 40 MG tablet Take 40 mg by mouth daily.   Yes [provider]  glipiZIDE (GLUCOTROL XL) 10 MG 24 hr tablet Take 1 tablet by mouth daily. 10/14/16  Yes [provider]  HYDROcodone-acetaminophen (NORCO/VICODIN)  5-325 MG per tablet Take 1-2 tablets by mouth every 6 (six) hours as needed for moderate pain.   Yes [provider]  metFORMIN (GLUCOPHAGE-XR) 500 MG 24 hr tablet Take 1 tablet by mouth 2 (two) times daily. 10/06/16  Yes [provider]  metolazone (ZAROXOLYN) 5 MG tablet Take 1 tablet by mouth 3 (three) times a week. Take on Monday, Wednesday, and Friday. 11/20/16  Yes [provider]  omeprazole (PRILOSEC) 20 MG capsule Take 1  capsule by mouth daily. 03/21/15  Yes [provider]  potassium chloride (K-DUR) 10 MEQ tablet Take 10 mEq by mouth daily.   Yes [provider]  rivaroxaban (XARELTO) 20 MG TABS tablet Take 20 mg by mouth daily. 04/08/14 12/04/16 Yes [provider]    Physical Exam:   97.6, 24, 85, 95/68  Constitutional: Appears ill but nontoxic, disheveled, poor grooming  eyes: Pupils, irises, lids appear normal.  ENT: Moderately hard of hearing. Deformity of left lip and face status post surgery. Tongue appears unremarkable.  Neck: No lymphadenopathy or masses. No thyromegaly.  Cardiovascular: Tachycardic, irregular, no murmur, rub or gallop. 4+ bilateral lower extremity edema.  Respiratory: Clear to auscultation bilaterally. Diminished breath sounds. No frank wheezes, rales or rhonchi. Mild increased respiratory effort.  Abdomen obese, soft, nontender. No hepatomegaly is appreciated. No hernias are noted.  Skin: Bilateral erythema of the lower extremities consistent with chronic skin changes secondary to edema. Mildly tender to palpation. There is some spontaneous drainage from the left calf.  Psychiatric: Odd affect, difficult to assess mood. Judgment and insight appear poor.  Musculoskeletal: Able to sit up in bed. Moves all extremities to command. Grossly normal tone, diminished strength globally.  Wt Readings from Last 3 Encounters:  12/04/16 (!) 163.7 kg (361 lb)  05/06/14 126.6 kg (279 lb)  04/04/14 107 kg (236 lb)    I have personally reviewed following labs and imaging studies  Labs:   Sodium 133, glucose 158, normal creatinine, LFTs unremarkable.  BNP 345  Lactic acid 1.98  CBC normal WBC. Hemoglobin 11.6. Platelets within normal limits.  Urinalysis negative.  Imaging studies:   Occlusive thrombus acute DVT right popliteal vein and right calf veins   Medical tests:   EKG and apparently reviewed atrial fibrillation, rapid ventricular  response, no previous available for comparison purposes. No ischemia noted.  Test discussed with performing physician:    Decision to obtain old records:     Review and summation of old records:   Seen by PCP 10/2016 for leg swelling, weeping from legs. Impression chronic systolic congestive heart failure with edema. Lasix and metolazone continued. Patient refused laboratory studies.  Principal Problem:   Acute CHF ([place]) Active Problems:   DVT (deep venous thrombosis) (HCC)   COPD (chronic obstructive pulmonary disease) (HCC)   Atrial fibrillation with RVR (HCC)   DM type 2 (diabetes mellitus, type 2) (HCC)   Morbid obesity (HCC)   Assessment/Plan #1: Acute on chronic CHF, type unknown, with massive volume overload  -IV diuresis with Lasix. Strict I/O. Daily weights. -2-D echocardiogram  #2: Atrial fibrillation with rapid ventricular response. -IV diltiazem infusion. Convert to PO when rate controlled.  #3: Acute DVT right lower extremity. History of multiple DVT reportedly. Patient does report compliance with Xarelto. Not clear whether this represents treatment failure. -Had Xarelto this AM. Consider Lovenox 6/13. Consult hematology 6/13 for further recommendations.   4: Bilateral lower extremity venous stasis changes. There is no evidence of cellulitis or infection. Lactic  acid was modestly elevated, however there is no evidence to suggest sepsis. -Treated volume overload as above.  #5: Reported history of multiple DVT PE. Plan as above   #6: Diabetes mellitus type 2: -Hold metformin while hospitalized. Sliding scale insulin.  #7: Morbid obesity. -Nutrition consult  #8: Cigarette smoker. -Nicotine patch.  PMH: throat cancer   Severity of Illness: The appropriate patient status for this patient is INPATIENT. Inpatient status is judged to be reasonable and necessary in order to provide the required intensity of service to ensure the patient's safety. The patient's  presenting symptoms, physical exam findings, and initial radiographic and laboratory data in the context of their chronic comorbidities is felt to place them at high risk for further clinical deterioration. Furthermore, it is not anticipated that the patient will be medically stable for discharge from the hospital within 2 midnights of admission. The following factors support the patient status of inpatient.   " The patient's presenting symptoms include leg pain and swelling. " The worrisome physical exam findings include massive volume overload and lower extremities, tachycardia. " The initial radiographic and laboratory data are worrisome because of acute right lower extremity DVT. " The chronic co-morbidities include diabetes mellitus, morbid obesity..   * I certify that at the point of admission it is my clinical judgment that the patient will require inpatient hospital care spanning beyond 2 midnights from the point of admission due to high intensity of service, high risk for further deterioration and high frequency of surveillance required.*     DVT prophylaxis:Xarelto Code Status: full Family Communication: Fianc at bedside   Time spent: 25 minutes  Murray Hodgkins, MD  [facility] Direct contact: [phone] --[place]  --[email or web address]; password [email or web address]  7PM-7AM contact night coverage as above  12/04/2016, 4:52 PM

## 2016-12-04 NOTE — ED Provider Notes (Signed)
[facility] DEPT Provider Note   CSN: [id number] Arrival date & time: 12/04/16  1022     History   Chief Complaint Chief Complaint  Patient presents with  . Leg Swelling    HPI Keith Kelly is a 61 y.o. male.  HPI Complains of bilateral leg swelling and pain for one month, he denies fever denies vomiting denies shortness of breath. He's been treated with his legs being wrapped, without relief. He denies noncompliance with Xarelto. He denies shortness of breath. Pain is worse with pressing on his legs. He also reports that he is unable to walk more than 5 or 6 feet in the past 3 weeks due to pain. He also reports swelling in his abdomen for the past few weeks No other associated symptoms Past Medical History:  Diagnosis Date  . Asthma   . Atrial fibrillation ([place])   . Blood in urine   . Cancer ([place])   . Cellulitis of right lower extremity   . COPD (chronic obstructive pulmonary disease) ([place])   . 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 MI (myocardial infarction) ([place])   . Peripheral edema   . Right leg DVT [facility])     Patient Active Problem List   Diagnosis Date Noted  . Abnormal ECG 05/06/2014  . DVT (deep venous thrombosis) ([place]) 05/06/2014  . COPD (chronic obstructive pulmonary disease) ([place]) 05/06/2014  . Throat cancer ([place]) 05/06/2014  . Left ventricular hypertrophy 05/06/2014    Past Surgical History:  Procedure Laterality Date  . LEG SURGERY    . THROAT SURGERY    . TONSILLECTOMY         Home Medications    Prior to Admission medications   Medication Sig Start Date End Date Taking? Authorizing Provider  budesonide-formoterol (SYMBICORT) 160-4.5 MCG/ACT inhaler Inhale 2 puffs into the lungs 2 (two) times daily.    [provider]  etodolac (LODINE) 400 MG tablet Take 400 mg by mouth daily as needed for mild pain or moderate pain.    [provider]  furosemide (LASIX) 20 MG tablet Take 1  tablet by mouth daily. Taking this (for 3 days only) along with Lasix 40 mg daily 05/04/14 06/03/14  [provider]  furosemide (LASIX) 40 MG tablet Take 40 mg by mouth daily.    [provider]  HYDROcodone-acetaminophen (NORCO/VICODIN) 5-325 MG per tablet Take 1-2 tablets by mouth every 6 (six) hours as needed for moderate pain.    [provider]  potassium chloride (K-DUR) 10 MEQ tablet Take 10 mEq by mouth daily.    [provider]  rivaroxaban (XARELTO) 20 MG TABS tablet Take 20 mg by mouth daily. 04/08/14 04/09/15  [provider]    Family History Family History  Problem Relation Age of Onset  . COPD Father   . Deep vein thrombosis Mother     Social History Social History  Substance Use Topics  . Smoking status: Current Every Day Smoker    Packs/day: 0.50    Years: 30.00    Types: Cigarettes  . Smokeless tobacco: Never Used  . Alcohol use No     Allergies   Meloxicam and Penicillins   Review of Systems Review of Systems  Constitutional: Negative.   HENT: Negative.   Respiratory: Negative.   Cardiovascular: Negative.   Gastrointestinal: Positive for abdominal distention.  Musculoskeletal: Negative.   Skin: Positive for color change.       Redness of  legs and weeping fluid from leg  Allergic/Immunologic: Positive for immunocompromised state.       Diabetic  Neurological: Negative.   Psychiatric/Behavioral: Negative.   All other systems reviewed and are negative.    Physical Exam Updated Vital Signs BP 106/76 (BP Location: Right Arm)   Pulse 64   Temp 97.6 F (36.4 C) (Oral)   Resp 18   SpO2 96%   Physical Exam  Constitutional:  Chronically ill-appearing  HENT:  Head: Normocephalic and atraumatic.  Eyes: Conjunctivae are normal. Pupils are equal, round, and reactive to light.  Neck: Neck supple. No tracheal deviation present. No thyromegaly present.  Cardiovascular:  No murmur heard. Tachycardic  irregularly irregular  Pulmonary/Chest: Effort normal and breath sounds normal.  Abdominal: Soft. Bowel sounds are normal. He exhibits no distension. There is no tenderness.  Morbidly obese. Lower abdomen minimally reddened, nontender  Genitourinary: Penis normal.  Genitourinary Comments: Perineum normal  Musculoskeletal: Normal range of motion. He exhibits no edema or tenderness.  Bilateral lower extremities with skin reddened week and thickened 3+ pretibial pitting edema bilaterally. DP pulses 2+ bilaterally the lateral upper extremities are redness or tenderness neurovascularly intact  Neurological: He is alert. Coordination normal.  Skin: Skin is warm and dry. No rash noted.  Psychiatric: He has a normal mood and affect.  Nursing note and vitals reviewed.    ED Treatments / Results  Labs (all labs ordered are listed, but only abnormal results are displayed) Labs Reviewed  CULTURE, BLOOD (ROUTINE X 2)  CULTURE, BLOOD (ROUTINE X 2)  COMPREHENSIVE METABOLIC PANEL  CBC WITH DIFFERENTIAL/PLATELET  URINALYSIS, ROUTINE W REFLEX MICROSCOPIC  BRAIN NATRIURETIC PEPTIDE  I-STAT CG4 LACTIC ACID, ED    EKG  EKG Interpretation  Date/Time:  Tuesday December 04 2016 10:31:54 EDT Ventricular Rate:  127 PR Interval:    QRS Duration: 111 QT Interval:  336 QTC Calculation: 489 R Axis:   -46 Text Interpretation:  Atrial fibrillation Ventricular premature complex LAD, consider left anterior fascicular block Low voltage, precordial leads Borderline prolonged QT interval Baseline wander in lead(s) V3 No old tracing to compare Confirmed by [place], Inocente Salles [phone]) on 12/04/2016 10:37:34 AM       Radiology No results found.  Procedures Procedures (including critical care time)  Medications Ordered in ED Medications  fentaNYL (SUBLIMAZE) injection 100 mcg (not administered)  aztreonam (AZACTAM) 2 g in dextrose 5 % 50 mL IVPB (not administered)  metroNIDAZOLE (FLAGYL) IVPB 500 mg (not  administered)  vancomycin (VANCOCIN) IVPB 1000 mg/200 mL premix (not administered)   Results for orders placed or performed during the hospital encounter of 12/04/16  Blood Culture (routine x 2)  Result Value Ref Range   Specimen Description BLOOD LEFT ARM    Special Requests      BOTTLES DRAWN AEROBIC AND ANAEROBIC Blood Culture adequate volume   Culture NO GROWTH < 12 HOURS    Report Status PENDING   Blood Culture (routine x 2)  Result Value Ref Range   Specimen Description BLOOD LEFT ARM    Special Requests      BOTTLES DRAWN AEROBIC AND ANAEROBIC Blood Culture adequate volume   Culture NO GROWTH < 12 HOURS    Report Status PENDING   Comprehensive metabolic panel  Result Value Ref Range   Sodium 133 (L) 135 - 145 mmol/L   Potassium 4.3 3.5 - 5.1 mmol/L   Chloride 95 (L) 101 - 111 mmol/L   CO2 29 22 - 32 mmol/L   Glucose, Bld  158 (H) 65 - 99 mg/dL   BUN 17 6 - 20 mg/dL   Creatinine, Ser 0.84 0.61 - 1.24 mg/dL   Calcium 8.6 (L) 8.9 - 10.3 mg/dL   Total Protein 7.8 6.5 - 8.1 g/dL   Albumin 3.2 (L) 3.5 - 5.0 g/dL   AST 17 15 - 41 U/L   ALT 12 (L) 17 - 63 U/L   Alkaline Phosphatase 123 38 - 126 U/L   Total Bilirubin 0.7 0.3 - 1.2 mg/dL   GFR calc non Af Amer >60 >60 mL/min   GFR calc Af Amer >60 >60 mL/min   Anion gap 9 5 - 15  CBC WITH DIFFERENTIAL  Result Value Ref Range   WBC 8.9 4.0 - 10.5 K/uL   RBC 5.04 4.22 - 5.81 MIL/uL   Hemoglobin 11.6 (L) 13.0 - 17.0 g/dL   HCT 39.0 39.0 - 52.0 %   MCV 77.4 (L) 78.0 - 100.0 fL   MCH 23.0 (L) 26.0 - 34.0 pg   MCHC 29.7 (L) 30.0 - 36.0 g/dL   RDW 18.1 (H) 11.5 - 15.5 %   Platelets 218 150 - 400 K/uL   Neutrophils Relative % 74 %   Neutro Abs 6.6 1.7 - 7.7 K/uL   Lymphocytes Relative 15 %   Lymphs Abs 1.3 0.7 - 4.0 K/uL   Monocytes Relative 9 %   Monocytes Absolute 0.8 0.1 - 1.0 K/uL   Eosinophils Relative 1 %   Eosinophils Absolute 0.1 0.0 - 0.7 K/uL   Basophils Relative 1 %   Basophils Absolute 0.1 0.0 - 0.1 K/uL    Urinalysis, Routine w reflex microscopic  Result Value Ref Range   Color, Urine COLORLESS (A) YELLOW   APPearance CLEAR CLEAR   Specific Gravity, Urine 1.003 (L) 1.005 - 1.030   pH 5.0 5.0 - 8.0   Glucose, UA NEGATIVE NEGATIVE mg/dL   Hgb urine dipstick NEGATIVE NEGATIVE   Bilirubin Urine NEGATIVE NEGATIVE   Ketones, ur NEGATIVE NEGATIVE mg/dL   Protein, ur NEGATIVE NEGATIVE mg/dL   Nitrite NEGATIVE NEGATIVE   Leukocytes, UA NEGATIVE NEGATIVE  Brain natriuretic peptide  Result Value Ref Range   B Natriuretic Peptide 345.0 (H) 0.0 - 100.0 pg/mL  I-Stat CG4 Lactic Acid, ED  (not at  [facility])  Result Value Ref Range   Lactic Acid, Venous 1.98 (HH) 0.5 - 1.9 mmol/L   US Venous Img Lower Bilateral  Result Date: 12/04/2016 CLINICAL DATA:  61 year old with personal history of bilateral lower extremity DVT, most recently involving the right lower extremity in 2015, presenting with bilateral lower extremity pain, edema and erythema progressively worsening over the past 3 weeks. Personal history of lung cancer. Patient currently anticoagulated with Xarelto. EXAM: BILATERAL LOWER EXTREMITY VENOUS DOPPLER ULTRASOUND TECHNIQUE: Gray-scale sonography with graded compression, as well as color Doppler and duplex ultrasound were performed to evaluate the lower extremity deep venous systems from the level of the common femoral vein and including the common femoral, femoral, profunda femoral, popliteal and calf veins including the posterior tibial, peroneal and gastrocnemius veins when visible. The superficial great saphenous vein was also interrogated. Spectral Doppler was utilized to evaluate flow at rest and with distal augmentation maneuvers in the common femoral, femoral and popliteal veins. COMPARISON:  Right lower extremity venous Doppler ultrasound 04/06/2014. Bilateral lower extremity Doppler ultrasound 02/25/2012. FINDINGS: The examination is technically difficult due to the bilateral lower extremity  edema. RIGHT LOWER EXTREMITY Common Femoral Vein: No evidence of thrombus. Normal compressibility, respiratory phasicity  and response to augmentation. Saphenofemoral Junction: No evidence of thrombus. Normal compressibility and flow on color Doppler imaging. Profunda Femoral Vein: No evidence of thrombus. Normal compressibility and flow on color Doppler imaging. Femoral Vein: No evidence of thrombus. Normal compressibility, respiratory phasicity and response to augmentation. Not optimally imaged distally. Popliteal Vein: Occlusive thrombus. No flow on color Doppler imaging. Calf Veins: No color Doppler flow identified in the posterior tibial or peroneal veins in the calf. Superficial Great Saphenous Vein: Not evaluated. Venous Reflux:  Not evaluated. Other Findings:  Diffuse lower extremity edema. LEFT LOWER EXTREMITY Common Femoral Vein: No evidence of thrombus. Normal compressibility, respiratory phasicity and response to augmentation. Saphenofemoral Junction: No evidence of thrombus. Normal compressibility and flow on color Doppler imaging. Profunda Femoral Vein: No evidence of thrombus. Normal compressibility and flow on color Doppler imaging. Femoral Vein: No evidence of thrombus. Normal compressibility, respiratory phasicity and response to augmentation. Popliteal Vein: No evidence of thrombus. Normal compressibility, respiratory phasicity and response to augmentation. Calf Veins: No evidence of thrombus. Normal compressibility and flow on color Doppler imaging. Superficial Great Saphenous Vein: Not evaluated. Venous Reflux:  Not evaluated. Other Findings:  Diffuse lower extremity edema. IMPRESSION: 1. Occlusive thrombus indicating acute DVT involving the right popliteal vein and the right calf veins. 2. No evidence of acute DVT involving the left lower extremity. Electronically Signed   By: Evangeline Dakin M.D.   On: 12/04/2016 14:19    Initial Impression / Assessment and Plan / ED Course  I have  reviewed the triage vital signs and the nursing notes.  Pertinent labs & imaging results that were available during my care of the patient were reviewed by me and considered in my medical decision making (see chart for details).     220 pm  Pt requests a ditional pain medication  After treatment with iv fentayl ,add'l iv fentanyl ordered.Dr Sarajane Jews consulted for admission Pt treated with iv antibiotics in light of cellulitic appearancece of bilateral legs and abdomen., in light of immunocompromised state.  Concern for cardiomyopathy as cause of atrial fibrillation with rapid ventricular response. Some concern for pulmonary embolism in light of DVT with tachycardia. Dr. Sarajane Jews arranged for admission to stepdown unit  Final Clinical Impressions(s) / ED Diagnoses  Diagnosis #1 atrial fibrillation with rapid ventricular response #2 cellulitis of bilateral lower extremities and of abdominal wall #3 DVT of right lower extremity Final diagnoses:  None  #4 hyperglycemia#4 hyperglycemia CRITICAL CARE Performed by: Orlie Dakin Total critical care time: 45 minutes Critical care time was exclusive of separately billable procedures and treating other patients. Critical care was necessary to treat or prevent imminent or life-threatening deterioration. Critical care was time spent personally by me on the following activities: development of treatment plan with patient and/or surrogate as well as nursing, discussions with consultants, evaluation of patient's response to treatment, examination of patient, obtaining history from patient or surrogate, ordering and performing treatments and interventions, ordering and review of laboratory studies, ordering and review of radiographic studies, pulse oximetry and re-evaluation of patient's condition.  New Prescriptions New Prescriptions   No medications on file     Orlie Dakin, MD 12/04/16 [phone]

## 2016-12-04 NOTE — ED Notes (Signed)
Pt states he Is going to stay

## 2016-12-04 NOTE — ED Triage Notes (Signed)
Pt is being brought in today for extensive swelling of the legs. This started 3 weeks ago. Pt also has wounds on legs that family member has been wrapping. In addition to leg swelling, back, and abdomen are also swollen.

## 2016-12-04 NOTE — ED Notes (Signed)
Pt and girlfriend insisting they do not want to stay and patient does not want to be admitted. When told the consequences of not receiving care he states, "Oh I'll be alright." Dr. Sarajane Jews on his way to come talk to patient.

## 2016-12-05 ENCOUNTER — Inpatient Hospital Stay (HOSPITAL_COMMUNITY): Payer: Medicaid Other

## 2016-12-05 DIAGNOSIS — Z7901 Long term (current) use of anticoagulants: Secondary | ICD-10-CM

## 2016-12-05 DIAGNOSIS — I509 Heart failure, unspecified: Secondary | ICD-10-CM

## 2016-12-05 DIAGNOSIS — I82431 Acute embolism and thrombosis of right popliteal vein: Secondary | ICD-10-CM

## 2016-12-05 LAB — BASIC METABOLIC PANEL
ANION GAP: 7 (ref 5–15)
BUN: 15 mg/dL (ref 6–20)
CALCIUM: 8.4 mg/dL — AB (ref 8.9–10.3)
CO2: 32 mmol/L (ref 22–32)
Chloride: 98 mmol/L — ABNORMAL LOW (ref 101–111)
Creatinine, Ser: 0.88 mg/dL (ref 0.61–1.24)
GFR calc Af Amer: >60 mL/min (ref >60)
GFR calc non Af Amer: >60 mL/min (ref >60)
GLUCOSE: 125 mg/dL — AB (ref 65–99)
Potassium: 4.1 mmol/L (ref 3.5–5.1)
Sodium: 137 mmol/L (ref 135–145)

## 2016-12-05 LAB — RETICULOCYTES
RBC.: 4.75 MIL/uL (ref 4.22–5.81)
RETIC COUNT ABSOLUTE: 114 10*3/uL (ref 19.0–186.0)
Retic Ct Pct: 2.4 % (ref 0.4–3.1)

## 2016-12-05 LAB — IRON AND TIBC
IRON: 28 ug/dL — AB (ref 45–182)
Saturation Ratios: 6 % — ABNORMAL LOW (ref 17.9–39.5)
TIBC: 455 ug/dL — ABNORMAL HIGH (ref 250–450)
UIBC: 427 ug/dL

## 2016-12-05 LAB — FOLATE: Folate: 7.9 ng/mL (ref >5.9)

## 2016-12-05 LAB — ANTITHROMBIN III: AntiThromb III Func: 86 % (ref 75–120)

## 2016-12-05 LAB — VITAMIN B12: Vitamin B-12: 742 pg/mL (ref 180–914)

## 2016-12-05 LAB — GLUCOSE, CAPILLARY: Glucose-Capillary: 120 mg/dL — ABNORMAL HIGH (ref 65–99)

## 2016-12-05 LAB — FERRITIN: Ferritin: 17 ng/mL — ABNORMAL LOW (ref 24–336)

## 2016-12-05 NOTE — Discharge Summary (Signed)
Patient left AMA  This morning, when I went to see the patient, he expressed desire to be discharged home. I explained to the patient that he is not medically ready to be discharged, he was still requiring oxygen O2 sats were 88% on room air. Also was seen by oncology this morning and I was awaiting his recommendations.  Patient did not want to stay in the hospital and decided to leave AMA.

## 2016-12-05 NOTE — Progress Notes (Signed)
This nurse, the hospitalist, and the oncologist, all attempted to inform the patient of his need to stay in the hospital due to his DVT, irregular HR, breathing, and skin issues. He is only 88% on RA. Educated patient and family about the issues related to low oxygen levels, as well as the concerns related to his elevated, irregular HR, and the possibility of his DVT becoming mobile. Patient and family verbalized understanding of these concerns but patient still insisted that he was leaving. AMA form signed. Both IVs removed intact without difficulty. Family helped patient get dressed and pushed him off the unit using his personal wheelchair.

## 2016-12-05 NOTE — Progress Notes (Signed)
In to see patient at this time, slightly more cooperative. [place] off and only 88% on RA. Replaced 4L  and SpO2 came up to 92%. Wheezing is noted from across the room, respirations slightly labored and cannot tolerate HOB lower than 30-45 degrees. Complains of some pain to his lower extremities, will provide prn medication for that. Will continue to monitor

## 2016-12-06 LAB — PROTEIN S, TOTAL: Protein S Ag, Total: 75 % (ref 60–150)

## 2016-12-06 LAB — HIV ANTIBODY (ROUTINE TESTING W REFLEX): HIV SCREEN 4TH GENERATION: NONREACTIVE

## 2016-12-06 LAB — HEMOGLOBIN A1C
HEMOGLOBIN A1C: 8.7 % — AB (ref 4.8–5.6)
MEAN PLASMA GLUCOSE: 203 mg/dL

## 2016-12-07 LAB — BETA-2-GLYCOPROTEIN I ABS, IGG/M/A
Beta-2 Glyco I IgG: <9 GPI IgG units (ref 0–20)
Beta-2-Glycoprotein I IgA: <9 GPI IgA units (ref 0–25)

## 2016-12-07 LAB — CARDIOLIPIN ANTIBODIES, IGG, IGM, IGA
Anticardiolipin IgA: <9 APL U/mL (ref 0–11)
Anticardiolipin IgM: <9 MPL U/mL (ref 0–12)

## 2016-12-07 LAB — PROTEIN C, TOTAL: Protein C, Total: 57 % — ABNORMAL LOW (ref 60–150)

## 2016-12-09 LAB — CULTURE, BLOOD (ROUTINE X 2)
CULTURE: NO GROWTH
Culture: NO GROWTH
SPECIAL REQUESTS: ADEQUATE
SPECIAL REQUESTS: ADEQUATE

## 2016-12-10 LAB — PROTHROMBIN GENE MUTATION

## 2016-12-10 LAB — FACTOR 5 LEIDEN

## 2016-12-10 NOTE — Progress Notes (Deleted)
Cardiology Office Note   Date:  12/10/2016   ID:  NOLAWI KANADY, DOB 1955/07/10, MRN [id number]  PCP:  Dione Housekeeper, MD  Cardiologist:   Jenkins Rouge, MD   No chief complaint on file.     History of Present Illness: Keith Kelly is a 61 y.o. male who presents for consultation regarding CHF.  History of morbid obesity DM  Referred by Dr Edrick Oh.   Seen by Dr Acie Fredrickson in 2015 for abnormal ECG. Noted LVH  Was supposed to have echo but not done  History of right LE DVT 2015 on xarelto COPD and throat cancer.   Admitted to hospital 12/04/16 with leg pain Volume overload with chronic venous stasis changes. Also noted to be  In atrial fibrillation. Acute DVT RLE despite indicating he had been taking xarelto. IV diuretics and cardizem given Was to have echo but left AMA  Seen by hematology Dr Sheldon Silvan He noted [place] not indicated in as effective in obese Population Recommended change to coumadin   DM poorly controlled A1c 8.7 BNP was 345  Past Medical History:  Diagnosis Date  . Asthma   . Atrial fibrillation ([place])   . Blood in urine   . Cancer ([place])   . Cellulitis of right lower extremity   . COPD (chronic obstructive pulmonary disease) ([place])   . Diabetes mellitus ([place])   . 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 MI (myocardial infarction) ([place])   . Peripheral edema   . Right leg DVT [facility])     Past Surgical History:  Procedure Laterality Date  . LEG SURGERY    . THROAT SURGERY    . TONSILLECTOMY       Current Outpatient Prescriptions  Medication Sig Dispense Refill  . Artificial Tear Ointment (DRY EYES OP) Apply 1 drop to eye daily as needed (dry eyes).    Marland Kitchen atorvastatin (LIPITOR) 20 MG tablet Take 20 mg by mouth daily at 6 PM.    . budesonide-formoterol (SYMBICORT) 160-4.5 MCG/ACT inhaler Inhale 2 puffs into the lungs 2 (two) times daily.    . furosemide (LASIX) 40 MG tablet Take 40 mg by mouth daily.    Marland Kitchen glipiZIDE  (GLUCOTROL XL) 10 MG 24 hr tablet Take 1 tablet by mouth daily.  2  . HYDROcodone-acetaminophen (NORCO/VICODIN) 5-325 MG per tablet Take 1-2 tablets by mouth every 6 (six) hours as needed for moderate pain.    . metFORMIN (GLUCOPHAGE-XR) 500 MG 24 hr tablet Take 1 tablet by mouth 2 (two) times daily.  3  . metolazone (ZAROXOLYN) 5 MG tablet Take 1 tablet by mouth 3 (three) times a week. Take on Monday, Wednesday, and Friday.  3  . omeprazole (PRILOSEC) 20 MG capsule Take 1 capsule by mouth daily.    . potassium chloride (K-DUR) 10 MEQ tablet Take 10 mEq by mouth daily.    . rivaroxaban (XARELTO) 20 MG TABS tablet Take 20 mg by mouth daily.     No current facility-administered medications for this visit.     Allergies:   Meloxicam and Penicillins    Social History:  The patient  reports that he has been smoking Cigarettes.  He has a 15.00 pack-year smoking history. He has never used smokeless tobacco. He reports that he does not drink alcohol or use drugs.   Family History:  The patient's family history includes COPD in his father; Deep vein thrombosis in his mother.    ROS:  Please see the history of present illness.   Otherwise, review of systems are positive for none.   All other systems are reviewed and negative.    PHYSICAL EXAM: VS:  There were no vitals taken for this visit. , BMI There is no height or weight on file to calculate BMI. Affect appropriate Healthy:  appears stated age 66: normal Neck supple with no adenopathy JVP normal no bruits no thyromegaly Lungs clear with no wheezing and good diaphragmatic motion Heart:  S1/S2 no murmur, no rub, gallop or click PMI normal Abdomen: benighn, BS positve, no tenderness, no AAA no bruit.  No HSM or HJR Distal pulses intact with no bruits No edema Neuro non-focal Skin warm and dry No muscular weakness    EKG:  AFib rate 127 no acute ST changes    Recent Labs: 12/04/2016: ALT 12; B Natriuretic Peptide 345.0;  Hemoglobin 11.6; Platelets 218 12/05/2016: BUN 15; Creatinine, Ser 0.88; Potassium 4.1; Sodium 137    Lipid Panel No results found for: CHOL, TRIG, HDL, CHOLHDL, VLDL, LDLCALC, LDLDIRECT    Wt Readings from Last 3 Encounters:  12/05/16 (!) 152.1 kg (335 lb 5.1 oz)  05/06/14 126.6 kg (279 lb)  04/04/14 107 kg (236 lb)      Other studies Reviewed: Additional studies/ records that were reviewed today include: Notes Dr Acie Fredrickson 2013 ER /AP hospital  Notes earlier this month CXR, ECG [place] and lab work.    ASSESSMENT AND PLAN:  1.  CHF 2. Recurrent DVT 3. Hematology 4. Afib 5. DM   Current medicines are reviewed at length with the patient today.  The patient does not have concerns regarding medicines.  The following changes have been made:  {PLAN; NO CHANGE:13088:s}  Labs/ tests ordered today include: *** No orders of the defined types were placed in this encounter.    Disposition:   FU with cardiology in 6 months      Signed, Jenkins Rouge, MD  12/10/2016 11:52 AM    [address], [place], [place]  [address] Phone: [phone]; Fax: [phone]

## 2016-12-11 ENCOUNTER — Ambulatory Visit: Payer: Medicaid Other | Admitting: Cardiovascular Disease

## 2016-12-19 NOTE — Progress Notes (Signed)
Cardiology Office Note   Date:  12/20/2016   ID:  Keith Kelly, DOB 03/09/1956, MRN [id number]  PCP:  Dione Housekeeper, MD  Cardiologist:   Jenkins Rouge, MD   No chief complaint on file.     History of Present Illness: Keith Kelly is a 61 y.o. male who presents for consultation regarding CHF. Referred by Dr Edrick Oh Last seen by Dr Acie Fredrickson in 2013 for dyspnea Smoker with history of throat cancer and COPD Had RLE  DVT in 2015 Chronically abnormal ECG with LVH. Been anticoagulated with xarelto  Duplex done 12/04/16 for edema Showed acute occlusive thrombus involving right popliteal and calf veins LLE ok. Admitted to hospital 12/04/16 but Left before complete evaluation. Seen by heme/onc Kefalas regarding presumed xarelto failure. Still smoking 1 ppd And sats in hospital at rest were 88% Hpercoagulabilit panel sent Started on coumadin. During hospitalization A1c elevated at 8.7 Protein C low 57 ( 60-150) BNP only 345 Cr .8  CT chest negative PE scattered less than 4 mm pulmonary nodules . No occult cancer  Also noted to be in afib on telemetry during admission ECG 2015 showed NSR Echo not performed   He has never had chest pain. He is barely ambulatory. Has chronic edema and needs to see wound care.  Very hard of hearing making interview difficult   Past Medical History:  Diagnosis Date  . Asthma   . Atrial fibrillation ([place])   . Blood in urine   . Cancer ([place])   . Cellulitis of right lower extremity   . COPD (chronic obstructive pulmonary disease) ([place])   . Diabetes mellitus ([place])   . 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 MI (myocardial infarction) ([place])   . Peripheral edema   . Right leg DVT [facility])     Past Surgical History:  Procedure Laterality Date  . LEG SURGERY    . THROAT SURGERY    . TONSILLECTOMY       Current Outpatient Prescriptions  Medication Sig Dispense Refill  . Artificial Tear Ointment (DRY EYES OP) Apply  1 drop to eye daily as needed (dry eyes).    Marland Kitchen atorvastatin (LIPITOR) 20 MG tablet Take 20 mg by mouth daily at 6 PM.    . budesonide-formoterol (SYMBICORT) 160-4.5 MCG/ACT inhaler Inhale 2 puffs into the lungs 2 (two) times daily.    . furosemide (LASIX) 40 MG tablet Take 40 mg by mouth daily.    Marland Kitchen glipiZIDE (GLUCOTROL XL) 10 MG 24 hr tablet Take 1 tablet by mouth daily.  2  . HYDROcodone-acetaminophen (NORCO/VICODIN) 5-325 MG per tablet Take 1-2 tablets by mouth every 6 (six) hours as needed for moderate pain.    . metFORMIN (GLUCOPHAGE-XR) 500 MG 24 hr tablet Take 1 tablet by mouth 2 (two) times daily.  3  . metolazone (ZAROXOLYN) 5 MG tablet Take 1 tablet by mouth 3 (three) times a week. Take on Monday, Wednesday, and Friday.  3  . omeprazole (PRILOSEC) 20 MG capsule Take 1 capsule by mouth daily.    . potassium chloride (K-DUR) 10 MEQ tablet Take 10 mEq by mouth daily.    . rivaroxaban (XARELTO) 20 MG TABS tablet Take 20 mg by mouth daily.     No current facility-administered medications for this visit.     Allergies:   Meloxicam and Penicillins    Social History:  The patient  reports that he has been smoking Cigarettes.  He  started smoking about 44 years ago. He has a 15.00 pack-year smoking history. He has never used smokeless tobacco. He reports that he does not drink alcohol or use drugs.   Family History:  The patient's family history includes COPD in his father; Deep vein thrombosis in his mother.    ROS:  Please see the history of present illness.   Otherwise, review of systems are positive for none.   All other systems are reviewed and negative.    PHYSICAL EXAM: VS:  BP 96/78 (BP Location: Left Arm, Cuff Size: Large)   Pulse (!) 122   Ht 6\' 3"  (1.905 m)   Wt (!) 142.3 kg (313 lb 12.8 oz)   SpO2 90%   BMI 39.22 kg/m  , BMI Body mass index is 39.22 kg/m. 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  No HSM or HJR Distal pulses intact with no bruits Plus 3 LE edema chronic stasis no active infection  Neuro non-focal Skin warm and dry LE weakness     EKG: AFib rate 127 nonspecific ST changes ECG 2015 showed NSR    Recent Labs: 12/04/2016: ALT 12; B Natriuretic Peptide 345.0; Hemoglobin 11.6; Platelets 218 12/05/2016: BUN 15; Creatinine, Ser 0.88; Potassium 4.1; Sodium 137    Lipid Panel No results found for: CHOL, TRIG, HDL, CHOLHDL, VLDL, LDLCALC, LDLDIRECT    Wt Readings from Last 3 Encounters:  12/20/16 (!) 142.3 kg (313 lb 12.8 oz)  12/05/16 (!) 152.1 kg (335 lb 5.1 oz)  05/06/14 126.6 kg (279 lb)      Other studies Reviewed: Additional studies/ records that were reviewed today include: Hospital notes admission 12/04/16 labs CT, ECG CTA consult note Dr Sheldon Silvan .    ASSESSMENT AND PLAN:  1.  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 Afib good rate control and anticoagulation now with coumadin  3. DVT/Anticoagulation ? Hypercoagulable protein C a bit low DOAC failure now on coumadin  4. Obesity major issue regarding functional capacity  5. Smoking / COPD counseled on cessation no motivation to quit    Current medicines are reviewed at length with the patient today.  The patient does not have concerns regarding medicines.  The following changes have been made:  None   Labs/ tests ordered today include: Echo   Orders Placed This Encounter  Procedures  . ECHOCARDIOGRAM COMPLETE     Disposition:   FU with PRN if  further cardiology f/u is needed can f/ with Dr Acie Fredrickson who has seen him before Or with Ledell Noss MD which is most convenient for him      Signed, Jenkins Rouge, MD  12/20/2016 2:00 PM    [address], [place], [place]  [address] Phone: [phone]; Fax: [phone]

## 2016-12-20 ENCOUNTER — Encounter: Payer: Self-pay | Admitting: Cardiovascular Disease

## 2016-12-20 ENCOUNTER — Ambulatory Visit (INDEPENDENT_AMBULATORY_CARE_PROVIDER_SITE_OTHER): Payer: Medicaid Other | Admitting: Cardiovascular Disease

## 2016-12-20 VITALS — BP 96/78 | HR 122 | Ht 75.0 in | Wt 313.8 lb

## 2016-12-20 DIAGNOSIS — R0602 Shortness of breath: Secondary | ICD-10-CM

## 2016-12-20 NOTE — Patient Instructions (Signed)
Your physician recommends that you schedule a follow-up appointment in:  As needed   Your physician has requested that you have an echocardiogram ([place] OFF[facility]). Echocardiography is a painless test that uses sound waves to create images of your heart. It provides your doctor with information about the size and shape of your heart and how well your heart's chambers and valves are working. This procedure takes approximately one hour. There are no restrictions for this procedure.   No lab work ordered today    Your physician recommends that you continue on your current medications as directed. Please refer to the Current Medication list given to you today.     Thank you for choosing [facility] !

## 2016-12-23 ENCOUNTER — Encounter (HOSPITAL_COMMUNITY): Payer: Self-pay | Admitting: Emergency Medicine

## 2016-12-23 ENCOUNTER — Inpatient Hospital Stay (HOSPITAL_COMMUNITY)
Admission: EM | Admit: 2016-12-23 | Discharge: 2016-12-27 | DRG: 308 | Disposition: A | Discharge status: Home / Self Care | Payer: Medicaid Other | Attending: Family Medicine | Admitting: Family Medicine

## 2016-12-23 ENCOUNTER — Emergency Department (HOSPITAL_COMMUNITY): Payer: Medicaid Other

## 2016-12-23 DIAGNOSIS — H9190 Unspecified hearing loss, unspecified ear: Secondary | ICD-10-CM | POA: Diagnosis present

## 2016-12-23 DIAGNOSIS — D509 Iron deficiency anemia, unspecified: Secondary | ICD-10-CM | POA: Diagnosis present

## 2016-12-23 DIAGNOSIS — J9621 Acute and chronic respiratory failure with hypoxia: Secondary | ICD-10-CM | POA: Diagnosis present

## 2016-12-23 DIAGNOSIS — Z515 Encounter for palliative care: Secondary | ICD-10-CM | POA: Diagnosis not present

## 2016-12-23 DIAGNOSIS — Z7984 Long term (current) use of oral hypoglycemic drugs: Secondary | ICD-10-CM

## 2016-12-23 DIAGNOSIS — Z825 Family history of asthma and other chronic lower respiratory diseases: Secondary | ICD-10-CM

## 2016-12-23 DIAGNOSIS — I82509 Chronic embolism and thrombosis of unspecified deep veins of unspecified lower extremity: Secondary | ICD-10-CM | POA: Diagnosis present

## 2016-12-23 DIAGNOSIS — Z9119 Patient's noncompliance with other medical treatment and regimen: Secondary | ICD-10-CM

## 2016-12-23 DIAGNOSIS — Z7189 Other specified counseling: Secondary | ICD-10-CM

## 2016-12-23 DIAGNOSIS — I5021 Acute systolic (congestive) heart failure: Secondary | ICD-10-CM

## 2016-12-23 DIAGNOSIS — G4733 Obstructive sleep apnea (adult) (pediatric): Secondary | ICD-10-CM | POA: Diagnosis present

## 2016-12-23 DIAGNOSIS — Z86711 Personal history of pulmonary embolism: Secondary | ICD-10-CM

## 2016-12-23 DIAGNOSIS — Z85819 Personal history of malignant neoplasm of unspecified site of lip, oral cavity, and pharynx: Secondary | ICD-10-CM

## 2016-12-23 DIAGNOSIS — E662 Morbid (severe) obesity with alveolar hypoventilation: Secondary | ICD-10-CM | POA: Diagnosis present

## 2016-12-23 DIAGNOSIS — R32 Unspecified urinary incontinence: Secondary | ICD-10-CM | POA: Diagnosis present

## 2016-12-23 DIAGNOSIS — Z7901 Long term (current) use of anticoagulants: Secondary | ICD-10-CM

## 2016-12-23 DIAGNOSIS — Z66 Do not resuscitate: Secondary | ICD-10-CM | POA: Diagnosis present

## 2016-12-23 DIAGNOSIS — Z85828 Personal history of other malignant neoplasm of skin: Secondary | ICD-10-CM

## 2016-12-23 DIAGNOSIS — I252 Old myocardial infarction: Secondary | ICD-10-CM

## 2016-12-23 DIAGNOSIS — I4891 Unspecified atrial fibrillation: Principal | ICD-10-CM | POA: Diagnosis present

## 2016-12-23 DIAGNOSIS — F17200 Nicotine dependence, unspecified, uncomplicated: Secondary | ICD-10-CM | POA: Diagnosis present

## 2016-12-23 DIAGNOSIS — F17210 Nicotine dependence, cigarettes, uncomplicated: Secondary | ICD-10-CM | POA: Diagnosis present

## 2016-12-23 DIAGNOSIS — Z6839 Body mass index (BMI) 39.0-39.9, adult: Secondary | ICD-10-CM

## 2016-12-23 DIAGNOSIS — I5084 End stage heart failure: Secondary | ICD-10-CM | POA: Diagnosis present

## 2016-12-23 DIAGNOSIS — I5023 Acute on chronic systolic (congestive) heart failure: Secondary | ICD-10-CM | POA: Diagnosis present

## 2016-12-23 DIAGNOSIS — Z9981 Dependence on supplemental oxygen: Secondary | ICD-10-CM

## 2016-12-23 DIAGNOSIS — R609 Edema, unspecified: Secondary | ICD-10-CM

## 2016-12-23 DIAGNOSIS — J9601 Acute respiratory failure with hypoxia: Secondary | ICD-10-CM

## 2016-12-23 DIAGNOSIS — Z79899 Other long term (current) drug therapy: Secondary | ICD-10-CM

## 2016-12-23 DIAGNOSIS — J441 Chronic obstructive pulmonary disease with (acute) exacerbation: Secondary | ICD-10-CM | POA: Diagnosis present

## 2016-12-23 DIAGNOSIS — I82409 Acute embolism and thrombosis of unspecified deep veins of unspecified lower extremity: Secondary | ICD-10-CM | POA: Diagnosis present

## 2016-12-23 DIAGNOSIS — I878 Other specified disorders of veins: Secondary | ICD-10-CM | POA: Diagnosis present

## 2016-12-23 DIAGNOSIS — Z880 Allergy status to penicillin: Secondary | ICD-10-CM

## 2016-12-23 DIAGNOSIS — I429 Cardiomyopathy, unspecified: Secondary | ICD-10-CM | POA: Diagnosis present

## 2016-12-23 DIAGNOSIS — E119 Type 2 diabetes mellitus without complications: Secondary | ICD-10-CM | POA: Diagnosis present

## 2016-12-23 DIAGNOSIS — I872 Venous insufficiency (chronic) (peripheral): Secondary | ICD-10-CM | POA: Diagnosis present

## 2016-12-23 DIAGNOSIS — L309 Dermatitis, unspecified: Secondary | ICD-10-CM | POA: Diagnosis present

## 2016-12-23 LAB — CBC WITH DIFFERENTIAL/PLATELET
Basophils Absolute: 0 10*3/uL (ref 0.0–0.1)
Basophils Relative: 1 %
EOS ABS: 0.1 10*3/uL (ref 0.0–0.7)
Eosinophils Relative: 1 %
HCT: 36.1 % — ABNORMAL LOW (ref 39.0–52.0)
HEMOGLOBIN: 10.9 g/dL — AB (ref 13.0–17.0)
Lymphocytes Relative: 17 %
Lymphs Abs: 1.5 10*3/uL (ref 0.7–4.0)
MCH: 22.3 pg — ABNORMAL LOW (ref 26.0–34.0)
MCHC: 30.2 g/dL (ref 30.0–36.0)
MCV: 74 fL — ABNORMAL LOW (ref 78.0–100.0)
MONOS PCT: 9 %
Monocytes Absolute: 0.8 10*3/uL (ref 0.1–1.0)
NEUTROS PCT: 72 %
Neutro Abs: 6.3 10*3/uL (ref 1.7–7.7)
Platelets: 290 10*3/uL (ref 150–400)
RBC: 4.88 MIL/uL (ref 4.22–5.81)
RDW: 18.2 % — ABNORMAL HIGH (ref 11.5–15.5)
WBC: 8.7 10*3/uL (ref 4.0–10.5)

## 2016-12-23 LAB — GLUCOSE, CAPILLARY: Glucose-Capillary: 121 mg/dL — ABNORMAL HIGH (ref 65–99)

## 2016-12-23 LAB — TROPONIN I
TROPONIN I: 0.03 ng/mL — AB (ref <0.03)
TROPONIN I: 0.03 ng/mL — AB (ref <0.03)

## 2016-12-23 LAB — COMPREHENSIVE METABOLIC PANEL
ALK PHOS: 117 U/L (ref 38–126)
ALT: 12 U/L — ABNORMAL LOW (ref 17–63)
AST: 18 U/L (ref 15–41)
Albumin: 3.3 g/dL — ABNORMAL LOW (ref 3.5–5.0)
Anion gap: 11 (ref 5–15)
BUN: 22 mg/dL — AB (ref 6–20)
CHLORIDE: 92 mmol/L — AB (ref 101–111)
CO2: 34 mmol/L — ABNORMAL HIGH (ref 22–32)
CREATININE: 0.94 mg/dL (ref 0.61–1.24)
Calcium: 9.1 mg/dL (ref 8.9–10.3)
Glucose, Bld: 122 mg/dL — ABNORMAL HIGH (ref 65–99)
POTASSIUM: 3.2 mmol/L — AB (ref 3.5–5.1)
SODIUM: 137 mmol/L (ref 135–145)
TOTAL PROTEIN: 8.1 g/dL (ref 6.5–8.1)
Total Bilirubin: 0.7 mg/dL (ref 0.3–1.2)

## 2016-12-23 LAB — PROTIME-INR
INR: 1.79
Prothrombin Time: 21 seconds — ABNORMAL HIGH (ref 11.4–15.2)

## 2016-12-23 LAB — BRAIN NATRIURETIC PEPTIDE: B Natriuretic Peptide: 441 pg/mL — ABNORMAL HIGH (ref 0.0–100.0)

## 2016-12-23 LAB — CBG MONITORING, ED: Glucose-Capillary: 119 mg/dL — ABNORMAL HIGH (ref 65–99)

## 2016-12-23 MED ORDER — ATORVASTATIN CALCIUM 20 MG PO TABS
20.0000 mg | ORAL_TABLET | Freq: Every day | ORAL | Status: DC
  Administered 2016-12-24 – 2016-12-27 (×4): 20 mg via ORAL
  Filled 2016-12-23 (×4): qty 1

## 2016-12-23 MED ORDER — ASPIRIN 81 MG PO CHEW
324.0000 mg | CHEWABLE_TABLET | Freq: Once | ORAL | Status: DC
  Filled 2016-12-23: qty 4

## 2016-12-23 MED ORDER — INSULIN ASPART 100 UNIT/ML ~~LOC~~ SOLN
0.0000 [IU] | Freq: Three times a day (TID) | SUBCUTANEOUS | Status: DC
Start: 2016-12-24 — End: 2016-12-28
  Administered 2016-12-24: 3 [IU] via SUBCUTANEOUS
  Administered 2016-12-24: 2 [IU] via SUBCUTANEOUS
  Administered 2016-12-24 – 2016-12-25 (×2): 3 [IU] via SUBCUTANEOUS
  Administered 2016-12-25 – 2016-12-26 (×3): 2 [IU] via SUBCUTANEOUS
  Administered 2016-12-26: 3 [IU] via SUBCUTANEOUS
  Administered 2016-12-26 – 2016-12-27 (×3): 2 [IU] via SUBCUTANEOUS

## 2016-12-23 MED ORDER — MOMETASONE FURO-FORMOTEROL FUM 200-5 MCG/ACT IN AERO
2.0000 | INHALATION_SPRAY | Freq: Two times a day (BID) | RESPIRATORY_TRACT | Status: DC
  Administered 2016-12-23 – 2016-12-27 (×8): 2 via RESPIRATORY_TRACT
  Filled 2016-12-23: qty 8.8

## 2016-12-23 MED ORDER — FENTANYL CITRATE (PF) 100 MCG/2ML IJ SOLN
50.0000 ug | Freq: Once | INTRAMUSCULAR | Status: AC
  Administered 2016-12-23: 50 ug via INTRAVENOUS
  Filled 2016-12-23: qty 2

## 2016-12-23 MED ORDER — DILTIAZEM LOAD VIA INFUSION
20.0000 mg | Freq: Once | INTRAVENOUS | Status: AC
  Administered 2016-12-23: 20 mg via INTRAVENOUS
  Filled 2016-12-23: qty 20

## 2016-12-23 MED ORDER — METOLAZONE 5 MG PO TABS
5.0000 mg | ORAL_TABLET | ORAL | Status: DC
  Administered 2016-12-24 – 2016-12-26 (×2): 5 mg via ORAL
  Filled 2016-12-23 (×2): qty 1

## 2016-12-23 MED ORDER — ACETAMINOPHEN 325 MG PO TABS
650.0000 mg | ORAL_TABLET | ORAL | Status: DC | PRN
  Administered 2016-12-26: 650 mg via ORAL
  Filled 2016-12-23: qty 2

## 2016-12-23 MED ORDER — NICOTINE 14 MG/24HR TD PT24
14.0000 mg | MEDICATED_PATCH | Freq: Every day | TRANSDERMAL | Status: DC
  Administered 2016-12-23 – 2016-12-27 (×5): 14 mg via TRANSDERMAL
  Filled 2016-12-23 (×5): qty 1

## 2016-12-23 MED ORDER — ASPIRIN 81 MG PO CHEW
81.0000 mg | CHEWABLE_TABLET | Freq: Every day | ORAL | Status: DC
  Administered 2016-12-26 – 2016-12-27 (×2): 81 mg via ORAL
  Filled 2016-12-23 (×4): qty 1

## 2016-12-23 MED ORDER — TRIAMCINOLONE 0.1 % CREAM:EUCERIN CREAM 1:1
TOPICAL_CREAM | Freq: Two times a day (BID) | CUTANEOUS | Status: DC
  Administered 2016-12-24: 21:00:00 via TOPICAL
  Administered 2016-12-24: 1 via TOPICAL
  Administered 2016-12-25 (×2): via TOPICAL
  Administered 2016-12-26: 2 via TOPICAL
  Administered 2016-12-26 – 2016-12-27 (×2): via TOPICAL
  Filled 2016-12-23: qty 1

## 2016-12-23 MED ORDER — FUROSEMIDE 10 MG/ML IJ SOLN
40.0000 mg | Freq: Once | INTRAMUSCULAR | Status: AC
  Administered 2016-12-23: 40 mg via INTRAVENOUS
  Filled 2016-12-23: qty 4

## 2016-12-23 MED ORDER — FUROSEMIDE 40 MG PO TABS
40.0000 mg | ORAL_TABLET | Freq: Every day | ORAL | Status: DC
  Administered 2016-12-24: 40 mg via ORAL
  Filled 2016-12-23: qty 1

## 2016-12-23 MED ORDER — ONDANSETRON HCL 4 MG/2ML IJ SOLN: 4.0000 mg | Freq: Four times a day (QID) | INTRAMUSCULAR | Status: DC | PRN

## 2016-12-23 MED ORDER — DILTIAZEM HCL 100 MG IV SOLR
5.0000 mg/h | INTRAVENOUS | Status: DC
  Administered 2016-12-23: 7.5 mg/h via INTRAVENOUS
  Filled 2016-12-23: qty 100

## 2016-12-23 MED ORDER — DILTIAZEM HCL 100 MG IV SOLR
5.0000 mg/h | INTRAVENOUS | Status: DC
  Administered 2016-12-23: 5 mg/h via INTRAVENOUS
  Filled 2016-12-23: qty 100

## 2016-12-23 MED ORDER — SODIUM CHLORIDE 0.9% FLUSH
3.0000 mL | Freq: Two times a day (BID) | INTRAVENOUS | Status: DC
  Administered 2016-12-23 – 2016-12-27 (×8): 3 mL via INTRAVENOUS

## 2016-12-23 MED ORDER — SODIUM CHLORIDE 0.9% FLUSH: 3.0000 mL | INTRAVENOUS | Status: DC | PRN

## 2016-12-23 MED ORDER — MOMETASONE FURO-FORMOTEROL FUM 200-5 MCG/ACT IN AERO
INHALATION_SPRAY | RESPIRATORY_TRACT | Status: AC
  Filled 2016-12-23: qty 8.8

## 2016-12-23 MED ORDER — RIVAROXABAN 20 MG PO TABS: 20.0000 mg | ORAL_TABLET | Freq: Every day | ORAL | Status: DC

## 2016-12-23 MED ORDER — FERROUS SULFATE 325 (65 FE) MG PO TABS
325.0000 mg | ORAL_TABLET | Freq: Two times a day (BID) | ORAL | Status: DC
  Administered 2016-12-24 – 2016-12-27 (×8): 325 mg via ORAL
  Filled 2016-12-23 (×8): qty 1

## 2016-12-23 MED ORDER — ENOXAPARIN SODIUM 150 MG/ML ~~LOC~~ SOLN
140.0000 mg | Freq: Two times a day (BID) | SUBCUTANEOUS | Status: DC
  Filled 2016-12-23 (×2): qty 0.93

## 2016-12-23 MED ORDER — SODIUM CHLORIDE 0.9 % IV SOLN
1.0000 g | Freq: Once | INTRAVENOUS | Status: AC
  Administered 2016-12-23: 1 g via INTRAVENOUS
  Filled 2016-12-23: qty 10

## 2016-12-23 MED ORDER — SODIUM CHLORIDE 0.9 % IV SOLN: 250.0000 mL | INTRAVENOUS | Status: DC | PRN

## 2016-12-23 MED ORDER — PANTOPRAZOLE SODIUM 40 MG PO TBEC
40.0000 mg | DELAYED_RELEASE_TABLET | Freq: Every day | ORAL | Status: DC
  Administered 2016-12-24 – 2016-12-27 (×4): 40 mg via ORAL
  Filled 2016-12-23 (×4): qty 1

## 2016-12-23 MED ORDER — POTASSIUM CHLORIDE CRYS ER 10 MEQ PO TBCR
10.0000 meq | EXTENDED_RELEASE_TABLET | Freq: Every day | ORAL | Status: DC
  Administered 2016-12-24 – 2016-12-27 (×4): 10 meq via ORAL
  Filled 2016-12-23 (×5): qty 1

## 2016-12-23 MED ORDER — HYDROCODONE-ACETAMINOPHEN 5-325 MG PO TABS
1.0000 | ORAL_TABLET | Freq: Four times a day (QID) | ORAL | Status: DC | PRN
  Administered 2016-12-23 – 2016-12-26 (×7): 2 via ORAL
  Filled 2016-12-23 (×7): qty 2

## 2016-12-23 MED ORDER — TRIAMCINOLONE ACETONIDE 0.1 % EX CREA
TOPICAL_CREAM | CUTANEOUS | Status: AC
  Filled 2016-12-23: qty 15

## 2016-12-23 NOTE — ED Triage Notes (Signed)
Pt here for generalized edema, chronic in nature.  Wounds to legs continue to be nonhealing.

## 2016-12-23 NOTE — ED Notes (Signed)
Contact Lynne at [phone] for admission or discharge plans for patient.

## 2016-12-23 NOTE — Progress Notes (Signed)
ANTICOAGULATION CONSULT NOTE - Initial Consult  Pharmacy Consult for Lovenox and Coumadin Indication: DVT and Afib  Allergies  Allergen Reactions  . Meloxicam Other (See Comments)    Other reaction(s): Unknown UNKNOWN  . Penicillins Hives    Has patient had a PCN reaction causing immediate rash, facial/tongue/throat swelling, SOB or lightheadedness with hypotension: no Has patient had a PCN reaction causing severe rash involving mucus membranes or skin necrosis: no Has patient had a PCN reaction that required hospitalization: no Has patient had a PCN reaction occurring within the last 10 years: no If all of the above answers are "NO", then may proceed with Cephalosporin use.     Patient Measurements: Height: 6\' 3"  (190.5 cm) Weight: (!) 314 lb (142.4 kg) IBW/kg (Calculated) : 84.5  Vital Signs: Temp: 98.1 F (36.7 C) (07/01 1512) Temp Source: Oral (07/01 1512) BP: 102/55 (07/01 1900) Pulse Rate: 131 (07/01 1900)  Labs:  Recent Labs  12/23/16 1540  HGB 10.9*  HCT 36.1*  PLT 290  CREATININE 0.94  TROPONINI 0.03*    Estimated Creatinine Clearance: 125.7 mL/min (by C-G formula based on SCr of 0.94 mg/dL).   Medical History: Past Medical History:  Diagnosis Date  . Asthma   . Atrial fibrillation ([place])   . Blood in urine   . Cancer ([place])   . Cellulitis of right lower extremity   . COPD (chronic obstructive pulmonary disease) ([place])   . Diabetes mellitus ([place])   . 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 MI (myocardial infarction) ([place])   . Peripheral edema   . Right leg DVT ([facility])     Medications:  Prescriptions Prior to Admission  Medication Sig Dispense Refill Last Dose  . Artificial Tear Ointment (DRY EYES OP) Apply 1 drop to eye daily as needed (dry eyes).   Past Week at Unknown time  . atorvastatin (LIPITOR) 20 MG tablet Take 20 mg by mouth daily at 6 PM.   12/22/2016 at 1900  . budesonide-formoterol (SYMBICORT)  160-4.5 MCG/ACT inhaler Inhale 2 puffs into the lungs 2 (two) times daily.   12/22/2016 at Unknown time  . furosemide (LASIX) 40 MG tablet Take 40 mg by mouth daily.   12/23/2016 at 0830  . glipiZIDE (GLUCOTROL XL) 10 MG 24 hr tablet Take 1 tablet by mouth daily.  2 12/23/2016 at 0830  . HYDROcodone-acetaminophen (NORCO/VICODIN) 5-325 MG per tablet Take 1-2 tablets by mouth every 6 (six) hours as needed for moderate pain.   12/23/2016 at 0830  . metFORMIN (GLUCOPHAGE-XR) 500 MG 24 hr tablet Take 1 tablet by mouth 2 (two) times daily.  3 12/23/2016 at 0830  . omeprazole (PRILOSEC) 20 MG capsule Take 1 capsule by mouth daily.   12/22/2016 at Unknown time  . potassium chloride (K-DUR) 10 MEQ tablet Take 10 mEq by mouth daily.   12/23/2016 at 0830  . rivaroxaban (XARELTO) 20 MG TABS tablet Take 20 mg by mouth daily.   12/23/2016 at 0830  . metolazone (ZAROXOLYN) 5 MG tablet Take 1 tablet by mouth 3 (three) times a week. Take on Monday, Wednesday, and Friday.  3 12/21/2016    Assessment: 61 y.o. male with medical history significant of CHF, COPD, morbid obesity, multiple DVT/PE and recent admission (6/12-13) for acute on chronic CHF and afib with RVR for which he left AMA. He returns with similar symptoms, pain in both legs. Hematology on 6/12 recommended to stop Xarelto and change toCoumadin with Lovenox bridge.  The transition was not made when he left AMA. Will transition now.. Last dose of Xarelto administered was this AM. Will start Lovenox and Coumadin tomorrow.  Start INR monitoring 2 days after stopping rivaroxaban (INR values drawn sooner may be falsely elevated by rivaroxaban).   Goal of Therapy:  Monitor platelets by anticoagulation protocol: Yes   Plan:  Lovenox 1mg /kg(140mg ) sq every 12 hours 7/2 Coumadin on 7/2 PT-INR 7/3 Educate on lovenox and coumadin Monitor for S/S of bleeding  Isac Sarna, BS Vena [place], BCPS Clinical Pharmacist Pager [phone] 12/23/2016,9:51 PM

## 2016-12-23 NOTE — H&P (Signed)
History and Physical    AMYR SLUDER XAJ:[id number] DOB: [birth year] DOA: 12/23/2016  PCP: Dione Housekeeper, MD Consultants:  [facility] - cardiology; referred to "lung doctor", waiting for appointment Patient coming from: Home - lives with fiance; NOK: daughter, [place]  Chief Complaint: leg pain  HPI: DUSTY RACZKOWSKI is a 61 y.o. male with medical history significant of CHF, COPD, morbid obesity, multiple DVT/PE and recent admission (6/12-13) for acute on chronic CHF and afib with RVR for which he left AMA.  He presents today very similarly to the last admission, reporting that "I was hurting in my legs."  Problem has been ongoing for a month or longer.  "I got tired of it."  Does not notice afib with RVR.  Left AMA previously; reports this time, "I'm staying, don't worry about that."  He's had pain in both legs without change, without specific aggravating or alleviating factors or associated symptoms. He denies any escalation of swelling or symptoms or any particular reason for coming to the emergency department today other than leg pain.  He was seen by PA Kefelas during his prior brief hospitalization.  He has h/o DVT in 2013, 2015, 2018.  The recommendation was for Lovenox with transition to Coumadin with plan for outpatient f/u.  He was seen a few days ago (6/28) by Dr. Johnsie Cancel who reports low suspicion for CHF; edema thought to be from postphlebitic syndrome and multiple DVTs with plan for diuretics and home health/wound care.  Echo also recommended.  Noted good rate control and Coumadin for AC.   ED Course: While he presented for chronic leg pain, the ER physician was much more concerned about his afib with RVR and started him on a Cardizem drip.  He was also given Fentanyl for pain and calcium gluconate.  Review of Systems: As per HPI; otherwise review of systems reviewed and negative.   Ambulatory Status:  Ambualtes with a cane or rides in a wheelchair  Past Medical History:    Diagnosis Date  . Asthma   . Atrial fibrillation ([place])   . Blood in urine   . Cancer ([place])   . Cellulitis of right lower extremity   . COPD (chronic obstructive pulmonary disease) ([place])   . Diabetes mellitus ([place])   . 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 MI (myocardial infarction) ([place])   . Peripheral edema   . Right leg DVT [facility])     Past Surgical History:  Procedure Laterality Date  . LEG SURGERY    . THROAT SURGERY    . TONSILLECTOMY      Social History   Social History  . Marital status: Divorced    Spouse name: N/A  . Number of children: N/A  . Years of education: N/A   Occupational History  . disabled    Social History Main Topics  . Smoking status: Current Every Day Smoker    Packs/day: 0.50    Years: 45.00    Types: Cigarettes    Start date: 04/07/1972  . Smokeless tobacco: Never Used  . Alcohol use No  . Drug use: No  . Sexual activity: Not on file   Other Topics Concern  . Not on file   Social History Narrative  . No narrative on file    Allergies  Allergen Reactions  . Meloxicam Other (See Comments)    Other reaction(s): Unknown UNKNOWN  . Penicillins Hives    Has patient had a PCN reaction  causing immediate rash, facial/tongue/throat swelling, SOB or lightheadedness with hypotension: no Has patient had a PCN reaction causing severe rash involving mucus membranes or skin necrosis: no Has patient had a PCN reaction that required hospitalization: no Has patient had a PCN reaction occurring within the last 10 years: no If all of the above answers are "NO", then may proceed with Cephalosporin use.     Family History  Problem Relation Age of Onset  . COPD Father   . Deep vein thrombosis Mother     Prior to Admission medications   Medication Sig Start Date End Date Taking? Authorizing Provider  Artificial Tear Ointment (DRY EYES OP) Apply 1 drop to eye daily as needed (dry eyes).    [provider]  atorvastatin (LIPITOR) 20 MG tablet Take 20 mg by mouth daily at 6 PM.    [provider]  budesonide-formoterol (SYMBICORT) 160-4.5 MCG/ACT inhaler Inhale 2 puffs into the lungs 2 (two) times daily.    [provider]  furosemide (LASIX) 40 MG tablet Take 40 mg by mouth daily.    [provider]  glipiZIDE (GLUCOTROL XL) 10 MG 24 hr tablet Take 1 tablet by mouth daily. 10/14/16   [provider]  HYDROcodone-acetaminophen (NORCO/VICODIN) 5-325 MG per tablet Take 1-2 tablets by mouth every 6 (six) hours as needed for moderate pain.    [provider]  metFORMIN (GLUCOPHAGE-XR) 500 MG 24 hr tablet Take 1 tablet by mouth 2 (two) times daily. 10/06/16   [provider]  metolazone (ZAROXOLYN) 5 MG tablet Take 1 tablet by mouth 3 (three) times a week. Take on Monday, Wednesday, and Friday. 11/20/16   [provider]  omeprazole (PRILOSEC) 20 MG capsule Take 1 capsule by mouth daily. 03/21/15   [provider]  potassium chloride (K-DUR) 10 MEQ tablet Take 10 mEq by mouth daily.    [provider]  rivaroxaban (XARELTO) 20 MG TABS tablet Take 20 mg by mouth daily. 04/08/14 12/20/16  [provider]    Physical Exam: Vitals:   12/23/16 1750 12/23/16 1800 12/23/16 1830 12/23/16 1900  BP: (!) 93/59 111/87 99/82 (!) 102/55  Pulse: (!) 57 (!) 39 (!) 53 (!) 131  Resp:      Temp:      TempSrc:      SpO2: 96% 96% 94% 96%  Weight:      Height:         General: Appears calm and comfortable and is NAD.  Quite somnolent - reports he sleeps during the day and has difficulty sleeping at night. Eyes:  PERRL, EOMI, normal lids, iris ENT:  grossly normal hearing, lips & tongue, mmm.  Solitary tooth.  Surgical scar along left lower lip from removal for cancer Neck:  no LAD, masses or thyromegaly Cardiovascular:  Irregularly irregular with HR 110-130 throughout, no m/r/g.  Respiratory:  CTA bilaterally, no  w/r/r. Normal respiratory effort. Abdomen:  soft, ntnd, NABS.  Large, protuberant pannus. Skin:  no rash or induration seen on limited exam other than LE Musculoskeletal: Marked stasis dermatitis with weeping bilaterally Psychiatric: Somnolent with flat mood and affect, speech fluent and appropriate, AOx3 Neurologic:  CN 2-12 grossly intact, moves all extremities in coordinated fashion, sensation intact  Labs on Admission: I have personally reviewed following labs and imaging studies  CBC:  Recent Labs Lab 12/23/16 1540  WBC 8.7  NEUTROABS 6.3  HGB 10.9*  HCT 36.1*  MCV 74.0*  PLT 353   Basic Metabolic  Panel:  Recent Labs Lab 12/23/16 1540  NA 137  K 3.2*  CL 92*  CO2 34*  GLUCOSE 122*  BUN 22*  CREATININE 0.94  CALCIUM 9.1   GFR: Estimated Creatinine Clearance: 125.7 mL/min (by C-G formula based on SCr of 0.94 mg/dL). Liver Function Tests:  Recent Labs Lab 12/23/16 1540  AST 18  ALT 12*  ALKPHOS 117  BILITOT 0.7  PROT 8.1  ALBUMIN 3.3*   No results for input(s): LIPASE, AMYLASE in the last 168 hours. No results for input(s): AMMONIA in the last 168 hours. Coagulation Profile: No results for input(s): INR, PROTIME in the last 168 hours. Cardiac Enzymes:  Recent Labs Lab 12/23/16 1540  TROPONINI 0.03*   BNP (last 3 results) No results for input(s): PROBNP in the last 8760 hours. HbA1C: No results for input(s): HGBA1C in the last 72 hours. CBG:  Recent Labs Lab 12/23/16 1542  GLUCAP 119*   Lipid Profile: No results for input(s): CHOL, HDL, LDLCALC, TRIG, CHOLHDL, LDLDIRECT in the last 72 hours. Thyroid Function Tests: No results for input(s): TSH, T4TOTAL, FREET4, T3FREE, THYROIDAB in the last 72 hours. Anemia Panel: No results for input(s): VITAMINB12, FOLATE, FERRITIN, TIBC, IRON, RETICCTPCT in the last 72 hours. Urine analysis:    Component Value Date/Time   COLORURINE COLORLESS (A) 12/04/2016 1115   APPEARANCEUR CLEAR 12/04/2016 1115     LABSPEC 1.003 (L) 12/04/2016 1115   PHURINE 5.0 12/04/2016 1115   GLUCOSEU NEGATIVE 12/04/2016 1115   HGBUR NEGATIVE 12/04/2016 1115   BILIRUBINUR NEGATIVE 12/04/2016 1115   KETONESUR NEGATIVE 12/04/2016 1115   PROTEINUR NEGATIVE 12/04/2016 1115   UROBILINOGEN 0.2 02/25/2012 0849   NITRITE NEGATIVE 12/04/2016 1115   LEUKOCYTESUR NEGATIVE 12/04/2016 1115    Creatinine Clearance: Estimated Creatinine Clearance: 125.7 mL/min (by C-G formula based on SCr of 0.94 mg/dL).  Sepsis Labs: @LABRCNTIP (procalcitonin:4,lacticidven:4) )No results found for this or any previous visit (from the past 240 hour(s)).   Radiological Exams on Admission: Dg Chest 1 View  Result Date: 12/23/2016 CLINICAL DATA:  Lower extremity swelling EXAM: CHEST 1 VIEW COMPARISON:  09/27/2016 FINDINGS: Cardiomegaly. Scarring in the right lung base. No confluent acute airspace opacities or effusions. No edema. No acute bony abnormality. IMPRESSION: Cardiomegaly/chronic changes.  No active disease. Electronically Signed   By: Rolm Baptise M.D.   On: 12/23/2016 16:33    EKG: Independently reviewed.  Afib with rate 136, 142; nonspecific ST changes which appear to be rate-related  Assessment/Plan Principal Problem:   Atrial fibrillation with RVR (HCC) Active Problems:   DVT (deep venous thrombosis) (HCC)   DM type 2 (diabetes mellitus, type 2) (HCC)   Stasis dermatitis of both legs   Tobacco dependence   Afib with RVR -Patient presenting with recurrent afib.  -He is generally asymptomatic from this problem and may not be wholly compliant with treatment recommendations. -Previous recommendation during hematology on 6/12 was to stop Xarelto and change to Lovenox bridge to Coumadin; there is no indication that this actually took place since the patient signed out AMA.  At his f/u appt on 6/21, the PCP expressed concerns about patient compliance and determined that she thought the patient was better suited to continue  Xarelto. -Will now stop Xarelto and start Lovenox/Coumadin as per pharmacy. ill admit to SDU for Diltiazem drip as per protocol with plan to transition to PO Diltiazem once heart rate is controlled.   -Will cycle cardiac enzymes (Initial troponin 0.03) -Will provide ASA 324 mg PO x 1 and  ASA 81 mg PO daily.   -Will request Echocardiogram for further evaluation - it is currently scheduled as an outpatient for 7/12 but will instead order now. -Will also order TSH/free T4 and UDS.   -Will also risk stratify with FLP.   -Will repeat EKG in AM and plan to consult cardiology at that time. -BNP 441, previously 345 on 6/12; no current concern for volume overload.  Stasis dermatitis with H/o DVTs -This is thought to be due to postphlebitic syndrome and multiple DVTs -While he does have LE edema, his legs are most consistent with a chronic stasis-type dermatitis -Will request wound care consult -Will apply Eucerin:TAC BID to see if this improves their condition -Despite this being what has brought the patient in to the ER the last 2 times, this problem is most likely best managed as an outpatient as it is likely going to be chronic and unremitting unless the patient makes dramatic lifestyle changes (weight loss, exercise, tobacco cessation). -There is no evidence of cellulitis/infection at this time. -DVT management with lifelong anticoagulation; hematology recommended Coumadin so will start.  DM -Glucose 122 -A1c 8.7 on 6/13 -Hold PO meds. -Cover with SSI.  Tobacco dependence -Encourage cessation.  This was discussed with the patient and should be reviewed on an ongoing basis.   -Patch ordered.  Anemia -Hgb 10.9, MCV 74, RDW 18.2; prior Hgb 11.6 on 6/12 with iron studies c/w iron deficiency anemia on 6/13 -Will start FeSO4 supplementation   DVT prophylaxis:  Lovenox/Coumadin Code Status: NPO - confirmed with patient Family Communication: None present Disposition Plan:  Home once  clinically improved Consults called: Cardiology, wound care  Admission status: It is my clinical opinion that referral for OBSERVATION is reasonable and necessary in this patient based on the above information provided. The aforementioned taken together are felt to place the patient at high risk for further clinical deterioration. However it is anticipated that the patient may be medically stable for discharge from the hospital within 24 to 48 hours.    Karmen Bongo MD [facility]  If 7PM-7AM, please contact night-coverage [email or web address] Password [email or web address]  12/23/2016, 9:07 PM

## 2016-12-23 NOTE — Progress Notes (Signed)
Pt has multiple wounds with multiple stages of healing. Also has cracking and abrasions to various areas on the body. Pt has 1 tooth and does not wear dentures nor does he want them. Pt presents with poor hygiene, non-compliant with self care, self-neglect is to be noted and consult in for case management. Pt states he lives at home with girlfriend/fiance and she takes care of him and drives. He was given a bath with shampoo cap and provided with mouth care, he was drenched with urine and and had a significant amount of dirt with body odor. Pt appears to have a self care deficit.  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 Placed on contact percautions for weeping wounds. RN completed wound assessments and provided wound care, placed foams over areas as well as put xeroform dressing and curlex over bilateral weeping legs. Pt has an external catheter for periods of voiding. He had old stool on bottom when given a bath so incontinence is noted for both bowel and bladder. He has stated that fiance/girlfried "[name]" is the one appointed person that we as staff members can talk with and that she can speak to the other family members regarding hospital stay and care provided. RN will continue to monitor.   Ericka Pontiff, RN 9:16 PM 12/23/16

## 2016-12-23 NOTE — ED Provider Notes (Signed)
[facility] DEPT Provider Note   CSN: [id number] Arrival date & time: 12/23/16  1456     History   Chief Complaint Chief Complaint  Patient presents with  . Leg Swelling    HPI DONTAE MINERVA is a 61 y.o. male.   Leg Pain   This is a chronic problem. The current episode started more than 1 week ago. The problem occurs constantly. The problem has been gradually worsening. The pain is present in the right lower leg and left lower leg. The quality of the pain is described as aching and sharp. The pain is moderate. Associated symptoms include numbness and tingling. The symptoms are aggravated by standing. He has tried nothing for the symptoms. The treatment provided no relief.    Past Medical History:  Diagnosis Date  . Asthma   . Atrial fibrillation ([place])   . Blood in urine   . Cancer ([place])   . Cellulitis of right lower extremity   . COPD (chronic obstructive pulmonary disease) ([place])   . Diabetes mellitus ([place])   . 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 MI (myocardial infarction) ([place])   . Peripheral edema   . Right leg DVT [facility])     Patient Active Problem List   Diagnosis Date Noted  . Atrial fibrillation with RVR ([place]) 12/04/2016  . Acute CHF ([place]) 12/04/2016  . DM type 2 (diabetes mellitus, type 2) ([place]) 12/04/2016  . Morbid obesity ([place]) 12/04/2016  . Pressure injury of skin 12/04/2016  . Abnormal ECG 05/06/2014  . DVT (deep venous thrombosis) ([place]) 05/06/2014  . COPD (chronic obstructive pulmonary disease) ([place]) 05/06/2014  . Throat cancer ([place]) 05/06/2014  . Left ventricular hypertrophy 05/06/2014    Past Surgical History:  Procedure Laterality Date  . LEG SURGERY    . THROAT SURGERY    . TONSILLECTOMY         Home Medications    Prior to Admission medications   Medication Sig Start Date End Date Taking? Authorizing Provider  Artificial Tear Ointment (DRY EYES OP) Apply 1 drop to eye daily as needed (dry  eyes).    [provider]  atorvastatin (LIPITOR) 20 MG tablet Take 20 mg by mouth daily at 6 PM.    [provider]  budesonide-formoterol (SYMBICORT) 160-4.5 MCG/ACT inhaler Inhale 2 puffs into the lungs 2 (two) times daily.    [provider]  furosemide (LASIX) 40 MG tablet Take 40 mg by mouth daily.    [provider]  glipiZIDE (GLUCOTROL XL) 10 MG 24 hr tablet Take 1 tablet by mouth daily. 10/14/16   [provider]  HYDROcodone-acetaminophen (NORCO/VICODIN) 5-325 MG per tablet Take 1-2 tablets by mouth every 6 (six) hours as needed for moderate pain.    [provider]  metFORMIN (GLUCOPHAGE-XR) 500 MG 24 hr tablet Take 1 tablet by mouth 2 (two) times daily. 10/06/16   [provider]  metolazone (ZAROXOLYN) 5 MG tablet Take 1 tablet by mouth 3 (three) times a week. Take on Monday, Wednesday, and Friday. 11/20/16   [provider]  omeprazole (PRILOSEC) 20 MG capsule Take 1 capsule by mouth daily. 03/21/15   [provider]  potassium chloride (K-DUR) 10 MEQ tablet Take 10 mEq by mouth daily.    [provider]  rivaroxaban (XARELTO) 20 MG TABS tablet Take 20 mg by mouth daily. 04/08/14 12/20/16  [provider]    Family History Family History  Problem  Relation Age of Onset  . COPD Father   . Deep vein thrombosis Mother     Social History Social History  Substance Use Topics  . Smoking status: Current Every Day Smoker    Packs/day: 0.50    Years: 30.00    Types: Cigarettes    Start date: 04/07/1972  . Smokeless tobacco: Never Used  . Alcohol use No     Allergies   Meloxicam and Penicillins   Review of Systems Review of Systems  Respiratory: Positive for cough and shortness of breath. Chest tightness:     Cardiovascular: Positive for chest pain.  Genitourinary: Negative for flank pain.  Musculoskeletal: Negative for back pain and neck pain.  Neurological: Positive for  tingling and numbness.  All other systems reviewed and are negative.    Physical Exam Updated Vital Signs BP 95/77 (BP Location: Left Arm)   Pulse 66   Temp 98.1 F (36.7 C) (Oral)   Resp 20   Ht 6\' 3"  (1.905 m)   Wt (!) 142.4 kg (314 lb)   SpO2 96%   BMI 39.25 kg/m   Physical Exam  Constitutional: He appears well-developed and well-nourished.  HENT:  Head: Normocephalic and atraumatic.  Eyes: Conjunctivae and EOM are normal.  Neck: Normal range of motion.  Cardiovascular: An irregularly irregular rhythm present. Tachycardia present.   Pulmonary/Chest: Effort normal. No respiratory distress.  Abdominal: Soft. He exhibits no distension. There is no tenderness.  Musculoskeletal: Normal range of motion. He exhibits edema and tenderness.  Neurological: He is alert.  Skin: Skin is warm and dry. Rash (BLE) noted.  Nursing note and vitals reviewed.    ED Treatments / Results  Labs (all labs ordered are listed, but only abnormal results are displayed) Labs Reviewed  CBG MONITORING, ED - Abnormal; Notable for the following:       Result Value   Glucose-Capillary 119 (*)    All other components within normal limits  CBC WITH DIFFERENTIAL/PLATELET  COMPREHENSIVE METABOLIC PANEL  BRAIN NATRIURETIC PEPTIDE  TROPONIN I    EKG  EKG Interpretation None       Radiology No results found.  Procedures Procedures (including critical care time)  CRITICAL CARE Performed by: Merrily Pew Total critical care time: 35 minutes Critical care time was exclusive of separately billable procedures and treating other patients. Critical care was necessary to treat or prevent imminent or life-threatening deterioration. Critical care was time spent personally by me on the following activities: development of treatment plan with patient and/or surrogate as well as nursing, discussions with consultants, evaluation of patient's response to treatment, examination of patient, obtaining  history from patient or surrogate, ordering and performing treatments and interventions, ordering and review of laboratory studies, ordering and review of radiographic studies, pulse oximetry and re-evaluation of patient's condition.   Medications Ordered in ED Medications  diltiazem (CARDIZEM) 1 mg/mL load via infusion 20 mg (not administered)    And  diltiazem (CARDIZEM) 100 mg in dextrose 5 % 100 mL (1 mg/mL) infusion (not administered)  calcium gluconate 1 g in sodium chloride 0.9 % 100 mL IVPB (not administered)  fentaNYL (SUBLIMAZE) injection 50 mcg (not administered)     Initial Impression / Assessment and Plan / ED Course  I have reviewed the triage vital signs and the nursing notes.  Pertinent labs & imaging results that were available during my care of the patient were reviewed by me and considered in my medical decision making (see chart for details).  Multiple problems. Likely Afib w/ RVR causing woresening fluid overload vs 2/2 venous stasis from previously diagnosed DVT's.  Started diltiazem drip.  Started lasix. Requiring oxygen, tachypnea, mild distress but no access muscle use.   Will admit for further management. Patient states he will not leave AMA this time.   Final Clinical Impressions(s) / ED Diagnoses   Final diagnoses:  Atrial fibrillation with rapid ventricular response ([place])  Acute respiratory failure with hypoxia ([place])      Shaunda Tipping, Corene Cornea, MD 12/23/16 2144

## 2016-12-23 NOTE — ED Notes (Signed)
CRITICAL VALUE ALERT  Critical Value:  Troponin 0.03  Date & Time Notied:  12/23/2016  Provider Notified: Dr. Dayna Barker  Orders Received/Actions taken: None at this time

## 2016-12-24 ENCOUNTER — Observation Stay (HOSPITAL_BASED_OUTPATIENT_CLINIC_OR_DEPARTMENT_OTHER): Payer: Medicaid Other

## 2016-12-24 DIAGNOSIS — Z7901 Long term (current) use of anticoagulants: Secondary | ICD-10-CM | POA: Diagnosis not present

## 2016-12-24 DIAGNOSIS — Z880 Allergy status to penicillin: Secondary | ICD-10-CM | POA: Diagnosis not present

## 2016-12-24 DIAGNOSIS — R32 Unspecified urinary incontinence: Secondary | ICD-10-CM | POA: Diagnosis present

## 2016-12-24 DIAGNOSIS — D509 Iron deficiency anemia, unspecified: Secondary | ICD-10-CM | POA: Diagnosis present

## 2016-12-24 DIAGNOSIS — F17210 Nicotine dependence, cigarettes, uncomplicated: Secondary | ICD-10-CM | POA: Diagnosis present

## 2016-12-24 DIAGNOSIS — I872 Venous insufficiency (chronic) (peripheral): Secondary | ICD-10-CM | POA: Diagnosis not present

## 2016-12-24 DIAGNOSIS — J441 Chronic obstructive pulmonary disease with (acute) exacerbation: Secondary | ICD-10-CM | POA: Diagnosis present

## 2016-12-24 DIAGNOSIS — I82509 Chronic embolism and thrombosis of unspecified deep veins of unspecified lower extremity: Secondary | ICD-10-CM | POA: Diagnosis present

## 2016-12-24 DIAGNOSIS — Z79899 Other long term (current) drug therapy: Secondary | ICD-10-CM | POA: Diagnosis not present

## 2016-12-24 DIAGNOSIS — I519 Heart disease, unspecified: Secondary | ICD-10-CM | POA: Diagnosis not present

## 2016-12-24 DIAGNOSIS — I252 Old myocardial infarction: Secondary | ICD-10-CM | POA: Diagnosis not present

## 2016-12-24 DIAGNOSIS — F17200 Nicotine dependence, unspecified, uncomplicated: Secondary | ICD-10-CM | POA: Diagnosis not present

## 2016-12-24 DIAGNOSIS — I82431 Acute embolism and thrombosis of right popliteal vein: Secondary | ICD-10-CM | POA: Diagnosis not present

## 2016-12-24 DIAGNOSIS — Z7984 Long term (current) use of oral hypoglycemic drugs: Secondary | ICD-10-CM | POA: Diagnosis not present

## 2016-12-24 DIAGNOSIS — J9601 Acute respiratory failure with hypoxia: Secondary | ICD-10-CM | POA: Diagnosis not present

## 2016-12-24 DIAGNOSIS — I878 Other specified disorders of veins: Secondary | ICD-10-CM | POA: Diagnosis present

## 2016-12-24 DIAGNOSIS — Z66 Do not resuscitate: Secondary | ICD-10-CM | POA: Diagnosis present

## 2016-12-24 DIAGNOSIS — I4891 Unspecified atrial fibrillation: Secondary | ICD-10-CM | POA: Diagnosis present

## 2016-12-24 DIAGNOSIS — E119 Type 2 diabetes mellitus without complications: Secondary | ICD-10-CM | POA: Diagnosis present

## 2016-12-24 DIAGNOSIS — I5021 Acute systolic (congestive) heart failure: Secondary | ICD-10-CM | POA: Diagnosis not present

## 2016-12-24 DIAGNOSIS — R609 Edema, unspecified: Secondary | ICD-10-CM | POA: Diagnosis present

## 2016-12-24 DIAGNOSIS — Z9119 Patient's noncompliance with other medical treatment and regimen: Secondary | ICD-10-CM | POA: Diagnosis not present

## 2016-12-24 DIAGNOSIS — Z85828 Personal history of other malignant neoplasm of skin: Secondary | ICD-10-CM | POA: Diagnosis not present

## 2016-12-24 DIAGNOSIS — L309 Dermatitis, unspecified: Secondary | ICD-10-CM | POA: Diagnosis present

## 2016-12-24 DIAGNOSIS — G4733 Obstructive sleep apnea (adult) (pediatric): Secondary | ICD-10-CM | POA: Diagnosis present

## 2016-12-24 DIAGNOSIS — Z6839 Body mass index (BMI) 39.0-39.9, adult: Secondary | ICD-10-CM | POA: Diagnosis not present

## 2016-12-24 DIAGNOSIS — Z515 Encounter for palliative care: Secondary | ICD-10-CM | POA: Diagnosis not present

## 2016-12-24 DIAGNOSIS — Z7189 Other specified counseling: Secondary | ICD-10-CM | POA: Diagnosis not present

## 2016-12-24 DIAGNOSIS — I429 Cardiomyopathy, unspecified: Secondary | ICD-10-CM | POA: Diagnosis not present

## 2016-12-24 DIAGNOSIS — E662 Morbid (severe) obesity with alveolar hypoventilation: Secondary | ICD-10-CM | POA: Diagnosis present

## 2016-12-24 DIAGNOSIS — E1110 Type 2 diabetes mellitus with ketoacidosis without coma: Secondary | ICD-10-CM | POA: Diagnosis not present

## 2016-12-24 DIAGNOSIS — J9621 Acute and chronic respiratory failure with hypoxia: Secondary | ICD-10-CM | POA: Diagnosis present

## 2016-12-24 DIAGNOSIS — I5023 Acute on chronic systolic (congestive) heart failure: Secondary | ICD-10-CM | POA: Diagnosis present

## 2016-12-24 LAB — ECHOCARDIOGRAM COMPLETE
HEIGHTINCHES: 75 in
Weight: 4934.78 oz

## 2016-12-24 LAB — BASIC METABOLIC PANEL
Anion gap: 13 (ref 5–15)
BUN: 22 mg/dL — ABNORMAL HIGH (ref 6–20)
CALCIUM: 8.8 mg/dL — AB (ref 8.9–10.3)
CO2: 36 mmol/L — ABNORMAL HIGH (ref 22–32)
CREATININE: 0.94 mg/dL (ref 0.61–1.24)
Chloride: 87 mmol/L — ABNORMAL LOW (ref 101–111)
Glucose, Bld: 125 mg/dL — ABNORMAL HIGH (ref 65–99)
Potassium: 2.5 mmol/L — CL (ref 3.5–5.1)
SODIUM: 136 mmol/L (ref 135–145)

## 2016-12-24 LAB — MRSA PCR SCREENING: MRSA by PCR: NEGATIVE

## 2016-12-24 LAB — RAPID URINE DRUG SCREEN, HOSP PERFORMED
Amphetamines: NOT DETECTED
Barbiturates: NOT DETECTED
Benzodiazepines: NOT DETECTED
COCAINE: NOT DETECTED
Opiates: POSITIVE — AB
Tetrahydrocannabinol: NOT DETECTED

## 2016-12-24 LAB — LIPID PANEL
CHOLESTEROL: 112 mg/dL (ref 0–200)
HDL: 19 mg/dL — ABNORMAL LOW (ref >40)
LDL Cholesterol: 74 mg/dL (ref 0–99)
TRIGLYCERIDES: 97 mg/dL (ref <150)
Total CHOL/HDL Ratio: 5.9 RATIO
VLDL: 19 mg/dL (ref 0–40)

## 2016-12-24 LAB — LACTIC ACID, PLASMA: LACTIC ACID, VENOUS: 1.1 mmol/L (ref 0.5–1.9)

## 2016-12-24 LAB — GLUCOSE, CAPILLARY
GLUCOSE-CAPILLARY: 195 mg/dL — AB (ref 65–99)
GLUCOSE-CAPILLARY: 183 mg/dL — AB (ref 65–99)
GLUCOSE-CAPILLARY: 186 mg/dL — AB (ref 65–99)
Glucose-Capillary: 146 mg/dL — ABNORMAL HIGH (ref 65–99)

## 2016-12-24 LAB — CBC
HEMATOCRIT: 35.9 % — AB (ref 39.0–52.0)
HEMOGLOBIN: 10.6 g/dL — AB (ref 13.0–17.0)
MCH: 22.1 pg — AB (ref 26.0–34.0)
MCHC: 29.5 g/dL — AB (ref 30.0–36.0)
MCV: 74.8 fL — ABNORMAL LOW (ref 78.0–100.0)
Platelets: 307 10*3/uL (ref 150–400)
RBC: 4.8 MIL/uL (ref 4.22–5.81)
RDW: 18.4 % — ABNORMAL HIGH (ref 11.5–15.5)
WBC: 8.7 10*3/uL (ref 4.0–10.5)

## 2016-12-24 LAB — TROPONIN I

## 2016-12-24 LAB — PROTIME-INR
INR: 1.65
Prothrombin Time: 19.7 seconds — ABNORMAL HIGH (ref 11.4–15.2)

## 2016-12-24 LAB — MAGNESIUM: Magnesium: 1.5 mg/dL — ABNORMAL LOW (ref 1.7–2.4)

## 2016-12-24 LAB — T4, FREE: FREE T4: 1.04 ng/dL (ref 0.61–1.12)

## 2016-12-24 MED ORDER — RIVAROXABAN 20 MG PO TABS
20.0000 mg | ORAL_TABLET | Freq: Every day | ORAL | Status: DC
  Administered 2016-12-24 – 2016-12-25 (×2): 20 mg via ORAL
  Filled 2016-12-24 (×2): qty 1

## 2016-12-24 MED ORDER — METOPROLOL TARTRATE 25 MG PO TABS
12.5000 mg | ORAL_TABLET | Freq: Two times a day (BID) | ORAL | Status: DC
  Administered 2016-12-24 – 2016-12-25 (×2): 12.5 mg via ORAL
  Filled 2016-12-24 (×2): qty 1

## 2016-12-24 MED ORDER — IPRATROPIUM BROMIDE 0.02 % IN SOLN
RESPIRATORY_TRACT | Status: AC
  Filled 2016-12-24: qty 2.5

## 2016-12-24 MED ORDER — LISINOPRIL 10 MG PO TABS
10.0000 mg | ORAL_TABLET | Freq: Every day | ORAL | Status: DC
  Administered 2016-12-24 – 2016-12-27 (×3): 10 mg via ORAL
  Filled 2016-12-24 (×4): qty 1

## 2016-12-24 MED ORDER — ALBUTEROL SULFATE (2.5 MG/3ML) 0.083% IN NEBU
INHALATION_SOLUTION | RESPIRATORY_TRACT | Status: AC
  Filled 2016-12-24: qty 3

## 2016-12-24 MED ORDER — POTASSIUM CHLORIDE CRYS ER 20 MEQ PO TBCR
40.0000 meq | EXTENDED_RELEASE_TABLET | ORAL | Status: AC
  Administered 2016-12-24 (×4): 40 meq via ORAL
  Filled 2016-12-24 (×4): qty 2

## 2016-12-24 MED ORDER — DILTIAZEM HCL 60 MG PO TABS
60.0000 mg | ORAL_TABLET | Freq: Four times a day (QID) | ORAL | Status: DC
  Administered 2016-12-24 (×2): 60 mg via ORAL
  Filled 2016-12-24 (×2): qty 1

## 2016-12-24 MED ORDER — FUROSEMIDE 10 MG/ML IJ SOLN
40.0000 mg | Freq: Two times a day (BID) | INTRAMUSCULAR | Status: DC
Start: 2016-12-24 — End: 2016-12-28
  Administered 2016-12-24 – 2016-12-26 (×4): 40 mg via INTRAVENOUS
  Filled 2016-12-24 (×5): qty 4

## 2016-12-24 NOTE — Plan of Care (Signed)
Problem: Health Behavior/Discharge Planning: Goal: Ability to manage health-related needs will improve Outcome: Not Progressing Pt encouraged to comply with treatment plan, assessed for self care and management skills, Case management consult in place. No evidence of learning at this time.Pt has left AMA less than a month ago and shows non-compliance and self neglect.  Ericka Pontiff, RN 5:19 AM 12/24/16

## 2016-12-24 NOTE — Progress Notes (Signed)
Pt is refusing to wear CPAP for sleep 

## 2016-12-24 NOTE — Progress Notes (Signed)
Pt is non-compliant with care being provided. He refused some medications as well as bipap. He states he's tired of being stuck for blood and that he just wants to go home and die there alone. I encouraged him to be open to care being provided to him. I educated him but no evidence he learned.His sats dropped to 88% MD aware, currently on HFNC sats now 95%. Pt is wheezing in all lobes, BP dropped to 79 sys, stopped Cardizem. HR is ranging from low 100's to 120's. MD notified about BP and cardizem. Md has given a order if needed for 250 bolus if needed. Not needed at this time BP has came up to 110/73, cardizem is still turned off.    Ericka Pontiff, RN 1:11 AM 12/24/16

## 2016-12-24 NOTE — Progress Notes (Signed)
*  PRELIMINARY RESULTS* Echocardiogram 2D Echocardiogram has been performed.  Leavy Cella 12/24/2016, 3:12 PM

## 2016-12-24 NOTE — Progress Notes (Signed)
After reading through the notes, found that pt left AMA on 12/05/16. He will need constant education and reinforcement during this hospital stay on the need for staying to recieve the medical attn he needs at this time. Pt has maintained his BP and RN to trial cardizem gtt again to see if he tolerates it. Lactic and blood cultures drawn, Pt sats are high 80's to mid 90's still on the HFNC, still refusing bipap at this time. RT tried the regular mask and the smaller nose mask. RN to continue to monitor and educate.  Ericka Pontiff, RN 4:51 AM 12/24/16

## 2016-12-24 NOTE — Plan of Care (Signed)
Problem: Tissue Perfusion: Goal: Risk factors for ineffective tissue perfusion will decrease Outcome: Not Progressing Pt has multiple wounds,ulcers, leg edema and weeping. Pt educated on the importance of elevating extremities and VTE prophylaxis.  Ericka Pontiff, RN 5:15 AM 12/24/16

## 2016-12-24 NOTE — Progress Notes (Signed)
PROGRESS NOTE    Keith Kelly  QQP:[id number] DOB: 1956/06/11 DOA: 12/23/2016 PCP: Dione Housekeeper, MD     Brief Narrative:  61 year old man admitted to the hospital on 7/1 due to bilateral leg pain. He was recently admitted for same and left AMA before much testing could be obtained. In the ED he was noticed to be in A. fib with RVR and admission was requested.   Assessment & Plan:   Principal Problem:   Atrial fibrillation with RVR (HCC) Active Problems:   DVT (deep venous thrombosis) ([place])   DM type 2 (diabetes mellitus, type 2) (HCC)   Stasis dermatitis of both legs   Tobacco dependence   Iron deficiency anemia   Acute systolic CHF (congestive heart failure) (HCC)   A. fib with RVR -Rate is now controlled, Cardizem drip has been discontinued. -Had initially been converted over to oral Cardizem, however after have received results of this echo with ejection fraction of 20-25% I have discontinued Cardizem and placed him on metoprolol for rate control. -He was started on Coumadin and Lovenox for his A. fib and history of DVT, however given his history of noncompliance I believe it would be easier for him to be on one of the newer anticoagulants, as he has been on xarelto in the past this will be reinitiated.  Acute systolic CHF -Echo shows ejection fraction of 20-25%, not technically sufficient to evaluate LV diastolic function. -Strict intake and output, daily weights, start patient on Lasix, lisinopril, metoprolol. -Will request cardiology consultation for further evaluation.  History of DVT -On anticoagulation  Type 2 diabetes -Fair control on current regimen.  DVT prophylaxis: Xarelto Code Status: DNR Family Communication: patient only Disposition Plan: pending medical stability and PT evaluation  Consultants:   Cardiology  Procedures:   None  Antimicrobials:  Anti-infectives    None       Subjective: Lying in bed, wondering when he will be  discharged, does not complain of chest pain or shortness of breath  Objective: Vitals:   12/24/16 1500 12/24/16 1515 12/24/16 1600 12/24/16 1700  BP: 92/74 103/69 (!) 85/73 (!) 111/92  Pulse: 96 (!) 108 (!) 127 (!) 122  Resp: 14 19 (!) 30 (!) 21  Temp:      TempSrc:      SpO2: 95% 97% 92% 91%  Weight:      Height:        Intake/Output Summary (Last 24 hours) at 12/24/16 1718 Last data filed at 12/24/16 1039  Gross per 24 hour  Intake              243 ml  Output              700 ml  Net             -457 ml   Filed Weights   12/23/16 1510 12/24/16 0500  Weight: (!) 142.4 kg (314 lb) (!) 139.9 kg (308 lb 6.8 oz)    Examination:  General exam: Alert, awake, oriented x 3, Morbidly obese  Respiratory system: Clear to auscultation. Respiratory effort normal. Cardiovascular system:RRR. No murmurs, rubs, gallops. Gastrointestinal system: Abdomen is nondistended, soft and nontender. Normal bowel sounds heard. Central nervous system: Alert and oriented. No focal neurological deficits. Extremities: 3+ pitting edema bilaterally, has dressings over lower extremity wounds.  Psychiatry: Judgement and insight appear normal. Mood & affect appropriate.     Data Reviewed: I have personally reviewed following labs and imaging studies  CBC:  Recent  Labs Lab 12/23/16 1540 12/24/16 0428  WBC 8.7 8.7  NEUTROABS 6.3  --   HGB 10.9* 10.6*  HCT 36.1* 35.9*  MCV 74.0* 74.8*  PLT 290 269   Basic Metabolic Panel:  Recent Labs Lab 12/23/16 1540 12/24/16 0428 12/24/16 0917  NA 137 136  --   K 3.2* 2.5*  --   CL 92* 87*  --   CO2 34* 36*  --   GLUCOSE 122* 125*  --   BUN 22* 22*  --   CREATININE 0.94 0.94  --   CALCIUM 9.1 8.8*  --   MG  --   --  1.5*   GFR: Estimated Creatinine Clearance: 124.5 mL/min (by C-G formula based on SCr of 0.94 mg/dL). Liver Function Tests:  Recent Labs Lab 12/23/16 1540  AST 18  ALT 12*  ALKPHOS 117  BILITOT 0.7  PROT 8.1  ALBUMIN 3.3*    No results for input(s): LIPASE, AMYLASE in the last 168 hours. No results for input(s): AMMONIA in the last 168 hours. Coagulation Profile:  Recent Labs Lab 12/23/16 2102 12/24/16 0428  INR 1.79 1.65   Cardiac Enzymes:  Recent Labs Lab 12/23/16 1540 12/23/16 2102 12/24/16 0427 12/24/16 0917  TROPONINI 0.03* 0.03* <0.03 <0.03   BNP (last 3 results) No results for input(s): PROBNP in the last 8760 hours. HbA1C: No results for input(s): HGBA1C in the last 72 hours. CBG:  Recent Labs Lab 12/23/16 1542 12/23/16 2223 12/24/16 0735 12/24/16 1111 12/24/16 1652  GLUCAP 119* 121* 146* 195* 183*   Lipid Profile:  Recent Labs  12/24/16 0428  CHOL 112  HDL 19*  LDLCALC 74  TRIG 97  CHOLHDL 5.9   Thyroid Function Tests:  Recent Labs  12/23/16 2102  FREET4 1.04   Anemia Panel: No results for input(s): VITAMINB12, FOLATE, FERRITIN, TIBC, IRON, RETICCTPCT in the last 72 hours. Urine analysis:    Component Value Date/Time   COLORURINE COLORLESS (A) 12/04/2016 1115   APPEARANCEUR CLEAR 12/04/2016 1115   LABSPEC 1.003 (L) 12/04/2016 1115   PHURINE 5.0 12/04/2016 1115   GLUCOSEU NEGATIVE 12/04/2016 1115   HGBUR NEGATIVE 12/04/2016 1115   BILIRUBINUR NEGATIVE 12/04/2016 1115   KETONESUR NEGATIVE 12/04/2016 1115   PROTEINUR NEGATIVE 12/04/2016 1115   UROBILINOGEN 0.2 02/25/2012 0849   NITRITE NEGATIVE 12/04/2016 1115   LEUKOCYTESUR NEGATIVE 12/04/2016 1115   Sepsis Labs: @LABRCNTIP (procalcitonin:4,lacticidven:4)  ) Recent Results (from the past 240 hour(s))  MRSA PCR Screening     Status: None   Collection Time: 12/23/16 10:00 PM  Result Value Ref Range Status   MRSA by PCR NEGATIVE NEGATIVE Final    Comment:        The GeneXpert MRSA Assay (FDA approved for NASAL specimens only), is one component of a comprehensive MRSA colonization surveillance program. It is not intended to diagnose MRSA infection nor to guide or monitor treatment for MRSA  infections.   Culture, blood (routine x 2)     Status: None (Preliminary result)   Collection Time: 12/24/16 12:33 AM  Result Value Ref Range Status   Specimen Description BLOOD LEFT ARM  Final   Special Requests   Final    BOTTLES DRAWN AEROBIC AND ANAEROBIC Blood Culture adequate volume   Culture NO GROWTH < 12 HOURS  Final   Report Status PENDING  Incomplete  Culture, blood (routine x 2)     Status: None (Preliminary result)   Collection Time: 12/24/16 12:45 AM  Result Value Ref Range Status  Specimen Description BLOOD RIGHT HAND  Final   Special Requests   Final    BOTTLES DRAWN AEROBIC AND ANAEROBIC Blood Culture adequate volume   Culture NO GROWTH < 12 HOURS  Final   Report Status PENDING  Incomplete         Radiology Studies: Dg Chest 1 View  Result Date: 12/23/2016 CLINICAL DATA:  Lower extremity swelling EXAM: CHEST 1 VIEW COMPARISON:  09/27/2016 FINDINGS: Cardiomegaly. Scarring in the right lung base. No confluent acute airspace opacities or effusions. No edema. No acute bony abnormality. IMPRESSION: Cardiomegaly/chronic changes.  No active disease. Electronically Signed   By: Rolm Baptise M.D.   On: 12/23/2016 16:33        Scheduled Meds: . aspirin  81 mg Oral Daily  . atorvastatin  20 mg Oral q1800  . diltiazem  60 mg Oral Q6H  . ferrous sulfate  325 mg Oral BID WC  . furosemide  40 mg Oral Daily  . insulin aspart  0-15 Units Subcutaneous TID WC  . metolazone  5 mg Oral Once per day on Mon Wed Fri  . mometasone-formoterol  2 puff Inhalation BID  . nicotine  14 mg Transdermal Daily  . pantoprazole  40 mg Oral Daily  . potassium chloride  10 mEq Oral Daily  . potassium chloride  40 mEq Oral Q4H  . rivaroxaban  20 mg Oral Daily  . sodium chloride flush  3 mL Intravenous Q12H  . triamcinolone 0.1 % cream : eucerin   Topical BID   Continuous Infusions: . sodium chloride    . diltiazem (CARDIZEM) infusion Stopped (12/24/16 1412)     LOS: 0 days    Time  spent: 30 minutes. Greater than 50% of this time was spent in direct contact with the patient coordinating care.     Lelon Frohlich, MD [facility] Pager [phone]  If 7PM-7AM, please contact night-coverage [email or web address] Password [email or web address] 12/24/2016, 5:18 PM

## 2016-12-24 NOTE — Progress Notes (Signed)
Patient has refused BiPAP has refused neb as he is wheezing . He basically is being belligerent .

## 2016-12-24 NOTE — Progress Notes (Signed)
ANTICOAGULATION CONSULT NOTE - Initial Consult  Pharmacy Consult for Xarelto (home med) Indication: DVT and Afib  Allergies  Allergen Reactions  . Meloxicam Other (See Comments)    Other reaction(s): Unknown UNKNOWN  . Penicillins Hives    Has patient had a PCN reaction causing immediate rash, facial/tongue/throat swelling, SOB or lightheadedness with hypotension: no Has patient had a PCN reaction causing severe rash involving mucus membranes or skin necrosis: no Has patient had a PCN reaction that required hospitalization: no Has patient had a PCN reaction occurring within the last 10 years: no If all of the above answers are "NO", then may proceed with Cephalosporin use.    Patient Measurements: Height: 6\' 3"  (190.5 cm) Weight: (!) 308 lb 6.8 oz (139.9 kg) IBW/kg (Calculated) : 84.5  Vital Signs: Temp: 97.3 F (36.3 C) (07/02 0736) Temp Source: Oral (07/02 0736) BP: 108/74 (07/02 0600) Pulse Rate: 106 (07/02 0736)  Labs:  Recent Labs  12/23/16 1540 12/23/16 2102 12/24/16 0427 12/24/16 0428 12/24/16 0917  HGB 10.9*  --   --  10.6*  --   HCT 36.1*  --   --  35.9*  --   PLT 290  --   --  307  --   LABPROT  --  21.0*  --  19.7*  --   INR  --  1.79  --  1.65  --   CREATININE 0.94  --   --  0.94  --   TROPONINI 0.03* 0.03* <0.03  --  <0.03   Estimated Creatinine Clearance: 124.5 mL/min (by C-G formula based on SCr of 0.94 mg/dL).  Medical History: Past Medical History:  Diagnosis Date  . Asthma   . Atrial fibrillation ([place])   . Blood in urine   . Cancer ([place])   . Cellulitis of right lower extremity   . COPD (chronic obstructive pulmonary disease) ([place])   . Diabetes mellitus ([place])   . 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 MI (myocardial infarction) ([place])   . Peripheral edema   . Right leg DVT (HCC)    Medications:  Prescriptions Prior to Admission  Medication Sig Dispense Refill Last Dose  . Artificial Tear Ointment  (DRY EYES OP) Apply 1 drop to eye daily as needed (dry eyes).   Past Week at Unknown time  . atorvastatin (LIPITOR) 20 MG tablet Take 20 mg by mouth daily at 6 PM.   12/22/2016 at 1900  . budesonide-formoterol (SYMBICORT) 160-4.5 MCG/ACT inhaler Inhale 2 puffs into the lungs 2 (two) times daily.   12/22/2016 at Unknown time  . furosemide (LASIX) 40 MG tablet Take 40 mg by mouth daily.   12/23/2016 at 0830  . glipiZIDE (GLUCOTROL XL) 10 MG 24 hr tablet Take 1 tablet by mouth daily.  2 12/23/2016 at 0830  . HYDROcodone-acetaminophen (NORCO/VICODIN) 5-325 MG per tablet Take 1-2 tablets by mouth every 6 (six) hours as needed for moderate pain.   12/23/2016 at 0830  . metFORMIN (GLUCOPHAGE-XR) 500 MG 24 hr tablet Take 1 tablet by mouth 2 (two) times daily.  3 12/23/2016 at 0830  . omeprazole (PRILOSEC) 20 MG capsule Take 1 capsule by mouth daily.   12/22/2016 at Unknown time  . potassium chloride (K-DUR) 10 MEQ tablet Take 10 mEq by mouth daily.   12/23/2016 at 0830  . rivaroxaban (XARELTO) 20 MG TABS tablet Take 20 mg by mouth daily.   12/23/2016 at 0830  . metolazone (ZAROXOLYN) 5 MG  tablet Take 1 tablet by mouth 3 (three) times a week. Take on Monday, Wednesday, and Friday.  3 12/21/2016   Assessment: 61 y.o. male with medical history significant of CHF, COPD, morbid obesity, multiple DVT/PE and recent admission (6/12-13) for acute on chronic CHF and afib with RVR for which he left AMA. He returns with similar symptoms, pain in both legs. Last dose of Xarelto administered was 7/1.  Pt has h/o non-compliance.  Resume Xarelto per Dr Jerilee Hoh.    Goal of Therapy:  Monitor platelets by anticoagulation protocol: Yes   Plan:  Resume Xarelto 20mg  PO daily Monitor for S/S of bleeding  Hart Robinsons, PharmD Clinical Pharmacist Pager:  [phone] 12/24/2016   12/24/2016,10:04 AM

## 2016-12-24 NOTE — Plan of Care (Signed)
Problem: Skin Integrity: Goal: Risk for impaired skin integrity will decrease Outcome: Not Progressing Pt has multiple wounds to different areas on the body with various stages of healing. Pt shows significant self negligence. Pt educated about the risk of letting urine and/or bowel stay on skin and how that directly affects the integrity and how it leads to breakdown and possible infection. Also educated on changing positions often to allow adequate pressure distrubution through out body.  Kyerra Vargo [place] Mote, RN 12/24/16 5:12 AM

## 2016-12-24 NOTE — Consult Note (Signed)
[place] Nurse wound consult note Reason for Consult: Multiple lower extremity abrasions, present on admission. Edema to bilateral lower extremities, history of multiple DVTs.  Has deep tissue injury to left heel, present on admission Incontinence associated dermatitis to perineal skin Wound type: Venous insufficiency MASD from incontinence Deep tissue injury to left heel, POA Pressure Injury POA: Yes Measurement: LEft heel :  3 cm x 3 cm intact maroon discoloration Scattered nonintact partial thickness lesions to bilateral lower legs. Will continue Xeroform.  Will add ace wraps for minimal compression.  Wound bed: ruddy red Drainage (amount, consistency, odor) Serous weeping to lower legs.  Periwound:Chronic skin changes to lower legs.  Dressing procedure/placement/frequency:Cleanse bilateral lower legs with soap and water and pat dry.  Cover wounds with Xeroform gauze, ABD pad. Wrap with  kerlix from base of toes to below knee and cover with ace wrap for minimal compression.   Cleanse perineal skin with soap and water and apply barrier cream twice daily.  Encourage to turn and reposition every two hours.  Will not follow at this time.  Please re-consult if needed.  Domenic Moras RN BSN [place] Pager [phone]

## 2016-12-25 ENCOUNTER — Ambulatory Visit (HOSPITAL_COMMUNITY): Payer: Medicaid Other

## 2016-12-25 DIAGNOSIS — I429 Cardiomyopathy, unspecified: Secondary | ICD-10-CM

## 2016-12-25 DIAGNOSIS — I519 Heart disease, unspecified: Secondary | ICD-10-CM

## 2016-12-25 LAB — GLUCOSE, CAPILLARY
GLUCOSE-CAPILLARY: 130 mg/dL — AB (ref 65–99)
GLUCOSE-CAPILLARY: 154 mg/dL — AB (ref 65–99)
Glucose-Capillary: 140 mg/dL — ABNORMAL HIGH (ref 65–99)
Glucose-Capillary: 199 mg/dL — ABNORMAL HIGH (ref 65–99)

## 2016-12-25 LAB — BASIC METABOLIC PANEL
ANION GAP: 12 (ref 5–15)
BUN: 24 mg/dL — AB (ref 6–20)
CALCIUM: 8.4 mg/dL — AB (ref 8.9–10.3)
CO2: 35 mmol/L — ABNORMAL HIGH (ref 22–32)
CREATININE: 0.91 mg/dL (ref 0.61–1.24)
Chloride: 86 mmol/L — ABNORMAL LOW (ref 101–111)
GFR calc Af Amer: >60 mL/min (ref >60)
GLUCOSE: 146 mg/dL — AB (ref 65–99)
Potassium: 3.2 mmol/L — ABNORMAL LOW (ref 3.5–5.1)
Sodium: 133 mmol/L — ABNORMAL LOW (ref 135–145)

## 2016-12-25 LAB — CBC
HCT: 35.4 % — ABNORMAL LOW (ref 39.0–52.0)
Hemoglobin: 10.5 g/dL — ABNORMAL LOW (ref 13.0–17.0)
MCH: 21.8 pg — ABNORMAL LOW (ref 26.0–34.0)
MCHC: 29.7 g/dL — ABNORMAL LOW (ref 30.0–36.0)
MCV: 73.6 fL — AB (ref 78.0–100.0)
PLATELETS: 292 10*3/uL (ref 150–400)
RBC: 4.81 MIL/uL (ref 4.22–5.81)
RDW: 18.2 % — AB (ref 11.5–15.5)
WBC: 9.4 10*3/uL (ref 4.0–10.5)

## 2016-12-25 MED ORDER — METOPROLOL SUCCINATE ER 25 MG PO TB24
12.5000 mg | ORAL_TABLET | Freq: Two times a day (BID) | ORAL | Status: DC
  Filled 2016-12-25 (×2): qty 1

## 2016-12-25 MED ORDER — WARFARIN VIDEO: Freq: Once | Status: DC

## 2016-12-25 MED ORDER — ENOXAPARIN SODIUM 150 MG/ML ~~LOC~~ SOLN
140.0000 mg | Freq: Two times a day (BID) | SUBCUTANEOUS | Status: DC
  Administered 2016-12-26 – 2016-12-27 (×3): 140 mg via SUBCUTANEOUS
  Filled 2016-12-25 (×5): qty 0.93

## 2016-12-25 NOTE — Consult Note (Addendum)
Cardiology Consultation:   Patient ID: CHIVAS NOTZ; [id number]; 1956/06/08   Admit date: 12/23/2016 Date of Consult: 12/25/2016  Primary Care Provider: Dione Housekeeper, MD Primary Cardiologist: Johnsie Cancel Primary Electrophysiologist:  NA   Patient Profile:   LUKE FALERO is a 61 y.o. male with a hx of DVT, CHF, and atrial fibrillation, with recent hospitalization for same on 12/04/2016, with duplex demonstrating acute occlusive thrombus involving the right popliteal and calf veins on anticoagulation,left before had complete evaluation, diabetes, throat CA, ongoing tobacco abuse, COPD, some medical noncompliance issues, who is being seen today for the evaluation of atrial fib at the request of Dr. Jerilee Hoh.   History of Present Illness:   Mr. Sexson was admitted on 12/23/2016 Who was admitted with atrial fibrillation with RVR, with recent hospitalization in June for CHF, DVT, was to be started on Coumadin therapy, the patient signed out AMA. He did follow-up with Dr. Johnsie Cancel on 12/20/2016 for evaluation of CHF, with chronic lower extremity edema. Dr. 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 At the time of office visit the patient was documented to have good rate control with atrial fibrillation, counseled on Smoking Cessation, Continued on Diuretics.   Patient's main complaint on admission was leg pain and edema. Rate of 142 bpm. Blood pressure on admission 157/121, O2 sat 96%, respirations 34. He was afebrile. Pertinent labs revealed potassium of 3.2, (potassium on 12/24/2016 was 2.5) CO2 34, creatinine 0.94, glucose 122 troponin 0.03,  with follow-up troponin 0.03 on 12/24/2016. Chest x-ray revealed cardiomegaly with no CHF or pneumonia.   The patient was given IV diltiazem bolus and started on IV drip, also given furosemide 40 mg IV, was given calcium gluconate and fentanyl. He has diuresed 2.257 L  since admission. Diltiazem drip has been  discontinued and had been converted to oral diltiazem, however echocardiogram was completed with an EF of 20-25%, calcium channel blockers were discontinued by Dr. Jerilee Hoh. The patient has been started on metoprolol for rate control, Coumadin has been continued with Lovenox bridging and followed by pharmacy. However, patient is requesting to be on a newer anticoagulation therapy and was started on Xarelto.  Of note, the patient has been seen by [facility], on 04/27/2015, via cardiology. At that time the patient was in sinus rhythm with PACs. He was referred back to cardiology at [facility]. Documentation reports no prior cardiac catheterization or stress test.  He is a poor historian, with significant hearing deficit. Hx is obtained from current and PMH. He denies chest pain, but has chronic dyspnea, deconditioning, chronic leg pain.  He denies medical non-compliance.   Past Medical History:  Diagnosis Date  . Asthma   . Atrial fibrillation ([place])   . Blood in urine   . Cancer ([place])   . Cellulitis of right lower extremity   . COPD (chronic obstructive pulmonary disease) ([place])   . Diabetes mellitus ([place])   . 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 MI (myocardial infarction) ([place])   . Peripheral edema   . Right leg DVT [facility])     Past Surgical History:  Procedure Laterality Date  . LEG SURGERY    . THROAT SURGERY    . TONSILLECTOMY       Inpatient Medications: Scheduled Meds: . aspirin  81 mg Oral Daily  . atorvastatin  20 mg Oral q1800  . ferrous sulfate  325 mg Oral BID WC  .  furosemide  40 mg Intravenous Q12H  . insulin aspart  0-15 Units Subcutaneous TID WC  . lisinopril  10 mg Oral Daily  . metolazone  5 mg Oral Once per day on Mon Wed Fri  . metoprolol tartrate  12.5 mg Oral BID  . mometasone-formoterol  2 puff Inhalation BID  . nicotine  14 mg Transdermal Daily  . pantoprazole  40 mg Oral Daily  . potassium chloride  10 mEq Oral Daily   . rivaroxaban  20 mg Oral Daily  . sodium chloride flush  3 mL Intravenous Q12H  . triamcinolone 0.1 % cream : eucerin   Topical BID   Continuous Infusions: . sodium chloride     PRN Meds: sodium chloride, acetaminophen, HYDROcodone-acetaminophen, ondansetron (ZOFRAN) IV, sodium chloride flush  Allergies:    Allergies  Allergen Reactions  . Meloxicam Other (See Comments)    Other reaction(s): Unknown UNKNOWN  . Penicillins Hives    Has patient had a PCN reaction causing immediate rash, facial/tongue/throat swelling, SOB or lightheadedness with hypotension: no Has patient had a PCN reaction causing severe rash involving mucus membranes or skin necrosis: no Has patient had a PCN reaction that required hospitalization: no Has patient had a PCN reaction occurring within the last 10 years: no If all of the above answers are "NO", then may proceed with Cephalosporin use.     Social History:   Social History   Social History  . Marital status: Divorced    Spouse name: N/A  . Number of children: N/A  . Years of education: N/A   Occupational History  . disabled    Social History Main Topics  . Smoking status: Current Every Day Smoker    Packs/day: 0.50    Years: 45.00    Types: Cigarettes    Start date: 04/07/1972  . Smokeless tobacco: Never Used  . Alcohol use No  . Drug use: No  . Sexual activity: Not on file   Other Topics Concern  . Not on file   Social History Narrative  . No narrative on file    Family History:   The patient's family history includes COPD in his father; Deep vein thrombosis in his mother.  ROS:  Please see the history of present illness.  ROS  All other ROS reviewed and negative.     Physical Exam/Data:   Vitals:   12/25/16 0600 12/25/16 0700 12/25/16 0755 12/25/16 0833  BP: 91/76 94/73    Pulse: (!) 191 (!) 196 (!) 155   Resp: (!) 25 16 (!) 23   Temp:   97.5 F (36.4 C)   TempSrc:   Oral   SpO2: 90% (!) 89% (!) 86% 95%  Weight:       Height:        Intake/Output Summary (Last 24 hours) at 12/25/16 0909 Last data filed at 12/25/16 0500  Gross per 24 hour  Intake              793 ml  Output             3150 ml  Net            -2357 ml   Filed Weights   12/23/16 1510 12/24/16 0500 12/25/16 0500  Weight: (!) 314 lb (142.4 kg) (!) 308 lb 6.8 oz (139.9 kg) (!) 309 lb 4.9 oz (140.3 kg)   Body mass index is 38.66 kg/m.  General:  Well nourished, well developed, in no acute distress, morbidly obese.  HEENT: Left facial droop with multiple missing teeth.  Lymph: no adenopathy Neck: no JVD Endocrine:  No thryomegaly Vascular: Soft carotid bruits; diminished pulses in the LEE.  Cardiac:  normal S1, S2; IRRR; distant heart sounds.  Lungs:  Diminished in the bases, some crackles, poor inspiratory effort.  Abd: Distended,  nontender, no hepatomegaly  Ext: 1+-2+ edema Ace wraps bilaterally with Telfa dressing to left pretibial.  Musculoskeletal:  Generalized deconditioning.  Skin: warm and dry  Neuro: Significantly hard of hearing.  Psych:  Normal affect   EKG:  The EKG was personally reviewed and demonstrates: Atrial fib with RVR, LAFB. Telemetry:  Telemetry was personally reviewed and demonstrates:  Atrial fib, rates in the 90's to low 100's.   Relevant CV Studies: Echocardiogram 12/24/2016 Left ventricle: The cavity size was moderately to severely   dilated. Wall thickness was increased in a pattern of moderate   LVH. Systolic function was severely reduced. The estimated   ejection fraction was in the range of 20% to 25%. The study is   not technically sufficient to allow evaluation of LV diastolic   function. - Aortic valve: Mildly calcified annulus. Trileaflet; mildly   thickened leaflets. There was mild to moderate regurgitation.   Valve area (VTI): 1.67 cm^2. Valve area (Vmax): 1.67 cm^2. - Mitral valve: Mildly calcified annulus. Mildly thickened leaflets   . There was mild regurgitation. - Left atrium:  The atrium was mildly dilated. - Right ventricle: The cavity size was moderately dilated. - Right atrium: The atrium was mildly dilated. - Atrial septum: No defect or patent foramen ovale was identified. - Pulmonary arteries: Systolic pressure was mildly increased. PA   peak pressure: 38 mm Hg (S). - Technically difficult study.  US Venous LE Extremity. 12/04/2016 IMPRESSION: 1. Occlusive thrombus indicating acute DVT involving the right popliteal vein and the right calf veins. 2. No evidence of acute DVT involving the left lower extremity.   NM stress test 09/2008 Impression:  - No evidence of significant ischemia or scar on imaging study  - There is a fixed defect involving the inferior wall. This is consistent with attenuation artifact.   - Overall systolic function is normal. EF = 60%.   - Diaphragmatic attenuation is noted  - Sensitivity and specifi~city of this test are reduced by the noted attenuation  - In the future, for larger patients, consider ordering PET rubidium study  Laboratory Data:  Chemistry Recent Labs Lab 12/23/16 1540 12/24/16 0428 12/25/16 0601  NA 137 136 133*  K 3.2* 2.5* 3.2*  CL 92* 87* 86*  CO2 34* 36* 35*  GLUCOSE 122* 125* 146*  BUN 22* 22* 24*  CREATININE 0.94 0.94 0.91  CALCIUM 9.1 8.8* 8.4*  GFRNONAA >60 >60 >60  GFRAA >60 >60 >60  ANIONGAP 11 13 12      Recent Labs Lab 12/23/16 1540  PROT 8.1  ALBUMIN 3.3*  AST 18  ALT 12*  ALKPHOS 117  BILITOT 0.7   Hematology Recent Labs Lab 12/23/16 1540 12/24/16 0428 12/25/16 0601  WBC 8.7 8.7 9.4  RBC 4.88 4.80 4.81  HGB 10.9* 10.6* 10.5*  HCT 36.1* 35.9* 35.4*  MCV 74.0* 74.8* 73.6*  MCH 22.3* 22.1* 21.8*  MCHC 30.2 29.5* 29.7*  RDW 18.2* 18.4* 18.2*  PLT 290 307 292   Cardiac Enzymes Recent Labs Lab 12/23/16 1540 12/23/16 2102 12/24/16 0427 12/24/16 0917  TROPONINI 0.03* 0.03* <0.03 <0.03   BNP Recent Labs Lab 12/23/16 1541  BNP 441.0*   Radiology/Studies:  Dg Chest 1 View  Result Date: 12/23/2016 CLINICAL DATA:  Lower extremity swelling EXAM: CHEST 1 VIEW COMPARISON:  09/27/2016 FINDINGS: Cardiomegaly. Scarring in the right lung base. No confluent acute airspace opacities or effusions. No edema. No acute bony abnormality. IMPRESSION: Cardiomegaly/chronic changes.  No active disease. Electronically Signed   By: Rolm Baptise M.D.   On: 12/23/2016 16:33    Assessment and Plan:   1. Atrial fib with RVR: History of same. Now on Xarelto for anticoagulation. I have read the note from [facility] with oncology. He did not recommend DOAC as he continued to have chronic DVT's and obesity, and was recommended for coumadin therapy instead, based upon 2016 literature, " International Society of Hemostasis and Thrombosis (ISTH) recommends avoidance of these agents in individuals with a body mass index (BMI) >40 kg/m2, or weight ?244 kg."   Uncertain if failure of Xarelto was not also due to non-compliance issues, as he has a history of same. Using coumadin may be more dangerous as he will need to have follow up labs consistently, and he is evasive concerning follow up with physicians consistently.   Heart rate is better controlled but not optimal for current EF. Now on metoprolol 12.5 mg BID.Consider increasing dose to 25 mg BID. HR may be related to DVT.  BP is soft but normal for low EF.    2. Hx of chronic DVT: Likely related to obesity, poor cardiac output, possible PVD. Marland Kitchen Anticardiolipin studies are negative. Has wound on left pretibial area, concerns for PAD as well. Not able to palpate pulses. Now on Xarelto. Will discuss with Dr. Bronson Ing about coumadin vs DOAC. Would favor simplified regimen due to poor compliance and follow up.   2. Systolic Dysfunction: Echo demonstrates significantly reduced EF of 20% to 25%. I have discussed this with him and he does not appear to comprehend the gravity of his current health status. Consider lasix gtt instead of  bolus doses if diureses slows down.Marland Kitchen He is responding to IV lasix with 2 liter output since admission. Still with evidence of overload. At least 20 lbs need to be diuresed. Labs are reviewed. Creatinine 0.91. Replete potassium.   With multiple CVRF's smoking, diabetes, questionable MI in the past, (with no documentation to review), hypercholesterolemia, may need eventual ischemic evaluation. Will wait until clinically stable. With DOAC on board and DVT's this will need to be planned later. Consider Entresto to optimize medical therapy once diureses is completed. May need to wait for BP to stablize. Review of home medications do not have him on ACE or ARB. No allergies listed to this.   Last NM stress test found in Care Everywhere 10/08/2008 no evidence for ischemia.   4. Hx of COPD: Do not see that he is on inhalers or treatments. Does not appear to have significant respiratory issues at this moment on O2.   5. Diabetes: Hgb A1C is elevated >8. Followed by PCP team  6. 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 Followed by oncology.   7. Left pretibial wound: Followed by wound care.     Signed, Jory Sims DNP, ANP.   12/25/2016 9:09 AM   The patient was seen and examined, and I agree with the history, physical exam, assessment and plan as documented above, with modifications as noted below. I have also personally reviewed all relevant documentation, old records, labs, and both radiographic and cardiovascular studies. I have also independently interpreted old and new ECG's.  61 yr  old male with aforementioned medical issues admitted with shortness of breath, CHF, and rapid atrial fibrillation.  Echocardiogram performed yesterday showed severely reduced LV systolic function.  Pt reports h/o MI "5 years ago".  He was evaluated by [facility] in 2016. Echo in 04/2014 showed normal LV systolic function.  Nuclear stress test in 2010 was negative for  ischemia.  He has DVT of the right popliteal and calf veins by Doppler on 12/04/16.  As per Kirby Crigler' note dated 12/04/16, factor Xa inhibitors (such as Xarelto) are a poor anticoagulant choice given morbid obesity and lack of data showing efficacy in this population.  He recommended warfarin with target INR 2.5-3.5 with LMWH.  For this reason, I will switch Xarelto to warfarin and have consulted pharmacy with respect to Lovenox bridging.  Given cardiomyopathy, I will switch metoprolol tartrate to succinate 12.5 mg BID as well as for HR control.  Would recommend outpatient 2-day nuclear stress test to evaluate for ischemia vs cath, but will defer to patient's primary cardiologist, Dr. Johnsie Cancel.   Kate Sable, MD, [facility]  12/25/2016 11:59 AM

## 2016-12-25 NOTE — Progress Notes (Signed)
PROGRESS NOTE    Keith Kelly  ZJI:[id number] DOB: [birth year] DOA: 12/23/2016 PCP: Dione Housekeeper, MD     Brief Narrative:  61 year old man admitted to the hospital on 7/1 due to bilateral leg pain. He was recently admitted for same and left AMA before much testing could be obtained. In the ED he was noticed to be in A. fib with RVR and admission was requested. 2-D echo shows an ejection fraction of 20-25%.   Assessment & Plan:   Principal Problem:   Atrial fibrillation with RVR (HCC) Active Problems:   DVT (deep venous thrombosis) (HCC)   DM type 2 (diabetes mellitus, type 2) (HCC)   Stasis dermatitis of both legs   Tobacco dependence   Iron deficiency anemia   Acute systolic CHF (congestive heart failure) (HCC)   A. fib with RVR -Rate is now controlled, Cardizem drip has been discontinued. -Continue metoprolol for rate control. -I had initially placed patient  on Xarelto for anticoagulation purposes, despite it being a less than adequate anticoagulation choice given his morbid obesity, given his  history of medical noncompliance and what I suspect will be a lack of follow-up for INR checks. -Cards has converted back over to Coumadin with a Lovenox bridge. Will need follow-up at the Coumadin clinic.  Acute systolic CHF/acute hypoxemic respiratory failure -Echo shows ejection fraction of 20-25%, not technically sufficient to evaluate LV diastolic function. -Patient has been started on Lasix, lisinopril, metoprolol. -He is 3 liters negative since admission, however he remains hypervolemic on exam. -Appreciate cardiology input and recommendations.  History of DVT -On anticoagulation  Type 2 diabetes -Fair control on current regimen.  DVT prophylaxis: coumadin/lovenox Code Status: DNR Family Communication: patient only Disposition Plan: pending medical stability and PT evaluation  Consultants:   Cardiology  Procedures:   None  Antimicrobials:    Anti-infectives    None       Subjective: Sitting in chair, fiance at bedside. He is already anxious about discharge. Denies chest pain, states shortness of breath has improved.  Objective: Vitals:   12/25/16 0800 12/25/16 0833 12/25/16 0900 12/25/16 1131  BP: 91/65  (!) 86/62   Pulse: (!) 118  97 81  Resp: 17  13   Temp:    97.6 F (36.4 C)  TempSrc:    Oral  SpO2: 91% 95% 93% 92%  Weight:      Height:        Intake/Output Summary (Last 24 hours) at 12/25/16 1517 Last data filed at 12/25/16 1018  Gross per 24 hour  Intake              560 ml  Output             3900 ml  Net            -3340 ml   Filed Weights   12/23/16 1510 12/24/16 0500 12/25/16 0500  Weight: (!) 142.4 kg (314 lb) (!) 139.9 kg (308 lb 6.8 oz) (!) 140.3 kg (309 lb 4.9 oz)    Examination:  General exam: Alert, awake, oriented x 3 Respiratory system: Clear to auscultation. Respiratory effort normal. Cardiovascular system:RRR. No murmurs, rubs, gallops. Gastrointestinal system: Abdomen is nondistended, soft and nontender. No organomegaly or masses felt. Normal bowel sounds heard. Central nervous system: Alert and oriented. No focal neurological deficits. Extremities: 2+ pitting edema bilaterally, lower extremities are wrapped. Skin: No rashes, lesions or ulcers Psychiatry: He seems to have poor insight into his current medical condition, mood appears  appropriate.     Data Reviewed: I have personally reviewed following labs and imaging studies  CBC:  Recent Labs Lab 12/23/16 1540 12/24/16 0428 12/25/16 0601  WBC 8.7 8.7 9.4  NEUTROABS 6.3  --   --   HGB 10.9* 10.6* 10.5*  HCT 36.1* 35.9* 35.4*  MCV 74.0* 74.8* 73.6*  PLT 290 307 409   Basic Metabolic Panel:  Recent Labs Lab 12/23/16 1540 12/24/16 0428 12/24/16 0917 12/25/16 0601  NA 137 136  --  133*  K 3.2* 2.5*  --  3.2*  CL 92* 87*  --  86*  CO2 34* 36*  --  35*  GLUCOSE 122* 125*  --  146*  BUN 22* 22*  --  24*   CREATININE 0.94 0.94  --  0.91  CALCIUM 9.1 8.8*  --  8.4*  MG  --   --  1.5*  --    GFR: Estimated Creatinine Clearance: 128.8 mL/min (by C-G formula based on SCr of 0.91 mg/dL). Liver Function Tests:  Recent Labs Lab 12/23/16 1540  AST 18  ALT 12*  ALKPHOS 117  BILITOT 0.7  PROT 8.1  ALBUMIN 3.3*   No results for input(s): LIPASE, AMYLASE in the last 168 hours. No results for input(s): AMMONIA in the last 168 hours. Coagulation Profile:  Recent Labs Lab 12/23/16 2102 12/24/16 0428  INR 1.79 1.65   Cardiac Enzymes:  Recent Labs Lab 12/23/16 1540 12/23/16 2102 12/24/16 0427 12/24/16 0917  TROPONINI 0.03* 0.03* <0.03 <0.03   BNP (last 3 results) No results for input(s): PROBNP in the last 8760 hours. HbA1C: No results for input(s): HGBA1C in the last 72 hours. CBG:  Recent Labs Lab 12/24/16 1111 12/24/16 1652 12/24/16 2144 12/25/16 0752 12/25/16 1130  GLUCAP 195* 183* 186* 140* 199*   Lipid Profile:  Recent Labs  12/24/16 0428  CHOL 112  HDL 19*  LDLCALC 74  TRIG 97  CHOLHDL 5.9   Thyroid Function Tests:  Recent Labs  12/23/16 2102  FREET4 1.04   Anemia Panel: No results for input(s): VITAMINB12, FOLATE, FERRITIN, TIBC, IRON, RETICCTPCT in the last 72 hours. Urine analysis:    Component Value Date/Time   COLORURINE COLORLESS (A) 12/04/2016 1115   APPEARANCEUR CLEAR 12/04/2016 1115   LABSPEC 1.003 (L) 12/04/2016 1115   PHURINE 5.0 12/04/2016 1115   GLUCOSEU NEGATIVE 12/04/2016 1115   HGBUR NEGATIVE 12/04/2016 1115   BILIRUBINUR NEGATIVE 12/04/2016 1115   KETONESUR NEGATIVE 12/04/2016 1115   PROTEINUR NEGATIVE 12/04/2016 1115   UROBILINOGEN 0.2 02/25/2012 0849   NITRITE NEGATIVE 12/04/2016 1115   LEUKOCYTESUR NEGATIVE 12/04/2016 1115   Sepsis Labs: @LABRCNTIP (procalcitonin:4,lacticidven:4)  ) Recent Results (from the past 240 hour(s))  MRSA PCR Screening     Status: None   Collection Time: 12/23/16 10:00 PM  Result Value  Ref Range Status   MRSA by PCR NEGATIVE NEGATIVE Final    Comment:        The GeneXpert MRSA Assay (FDA approved for NASAL specimens only), is one component of a comprehensive MRSA colonization surveillance program. It is not intended to diagnose MRSA infection nor to guide or monitor treatment for MRSA infections.   Culture, blood (routine x 2)     Status: None (Preliminary result)   Collection Time: 12/24/16 12:33 AM  Result Value Ref Range Status   Specimen Description BLOOD LEFT ARM  Final   Special Requests   Final    BOTTLES DRAWN AEROBIC AND ANAEROBIC Blood Culture adequate volume  Culture NO GROWTH 1 DAY  Final   Report Status PENDING  Incomplete  Culture, blood (routine x 2)     Status: None (Preliminary result)   Collection Time: 12/24/16 12:45 AM  Result Value Ref Range Status   Specimen Description BLOOD RIGHT HAND  Final   Special Requests   Final    BOTTLES DRAWN AEROBIC AND ANAEROBIC Blood Culture adequate volume   Culture NO GROWTH 1 DAY  Final   Report Status PENDING  Incomplete         Radiology Studies: Dg Chest 1 View  Result Date: 12/23/2016 CLINICAL DATA:  Lower extremity swelling EXAM: CHEST 1 VIEW COMPARISON:  09/27/2016 FINDINGS: Cardiomegaly. Scarring in the right lung base. No confluent acute airspace opacities or effusions. No edema. No acute bony abnormality. IMPRESSION: Cardiomegaly/chronic changes.  No active disease. Electronically Signed   By: Rolm Baptise M.D.   On: 12/23/2016 16:33        Scheduled Meds: . aspirin  81 mg Oral Daily  . atorvastatin  20 mg Oral q1800  . [START ON 12/26/2016] enoxaparin (LOVENOX) injection  140 mg Subcutaneous Q12H  . ferrous sulfate  325 mg Oral BID WC  . furosemide  40 mg Intravenous Q12H  . insulin aspart  0-15 Units Subcutaneous TID WC  . lisinopril  10 mg Oral Daily  . metolazone  5 mg Oral Once per day on Mon Wed Fri  . metoprolol succinate  12.5 mg Oral BID  . mometasone-formoterol  2 puff  Inhalation BID  . nicotine  14 mg Transdermal Daily  . pantoprazole  40 mg Oral Daily  . potassium chloride  10 mEq Oral Daily  . sodium chloride flush  3 mL Intravenous Q12H  . triamcinolone 0.1 % cream : eucerin   Topical BID  . [START ON 12/26/2016] warfarin   Does not apply Once   Continuous Infusions: . sodium chloride       LOS: 1 day    Time spent: 30 minutes. Greater than 50% of this time was spent in direct contact with the patient coordinating care.     Lelon Frohlich, MD [facility] Pager [phone]  If 7PM-7AM, please contact night-coverage [email or web address] Password [email or web address] 12/25/2016, 3:17 PM

## 2016-12-25 NOTE — Care Management Note (Addendum)
Case Management Note  Patient Details  Name: Keith Kelly MRN: [id number] Date of Birth: 04/15/1956  Subjective/Objective:    Adm with Afib with RVR.  Patient taking Xarelto prior to admission. From home with fiance. Walks with cane. Also has a WC . Has PCP, fiance drives him to appointments. Patient has Medicaid and has no issues affording medications.           Action/Plan: Patient agreeable to [facility] RN. He is not eligible for [facility] PT as Medicaid will not pay for physical therapy. CM will follow for oxygen needs.    Expected Discharge Date:    12/26/2016              Expected Discharge Plan:  [place]  In-House Referral:     Discharge planning Services  CM Consult  Post Acute Care Choice:  Home Health Choice offered to:  Patient  DME Arranged:    DME Agency:     HH Arranged:  RN [place] Agency:  [facility]  Status of Service:  In process, will continue to follow  If discussed at Long Length of Stay Meetings, dates discussed:    Additional Comments:  Patrisha Hausmann, Chauncey Reading, RN 12/25/2016, 3:08 PM

## 2016-12-25 NOTE — Progress Notes (Addendum)
ANTICOAGULATION CONSULT NOTE - Initial Consult  Pharmacy Consult for Lovenox and Coumadin Indication: DVT and Afib  Allergies  Allergen Reactions  . Meloxicam Other (See Comments)    Other reaction(s): Unknown UNKNOWN  . Penicillins Hives    Has patient had a PCN reaction causing immediate rash, facial/tongue/throat swelling, SOB or lightheadedness with hypotension: no Has patient had a PCN reaction causing severe rash involving mucus membranes or skin necrosis: no Has patient had a PCN reaction that required hospitalization: no Has patient had a PCN reaction occurring within the last 10 years: no If all of the above answers are "NO", then may proceed with Cephalosporin use.     Patient Measurements: Height: 6\' 3"  (190.5 cm) Weight: (!) 309 lb 4.9 oz (140.3 kg) IBW/kg (Calculated) : 84.5  Vital Signs: Temp: 97.6 F (36.4 C) (07/03 1131) Temp Source: Oral (07/03 1131) BP: 86/62 (07/03 0900) Pulse Rate: 81 (07/03 1131)  Labs:  Recent Labs  12/23/16 1540 12/23/16 2102 12/24/16 0427 12/24/16 0428 12/24/16 0917 12/25/16 0601  HGB 10.9*  --   --  10.6*  --  10.5*  HCT 36.1*  --   --  35.9*  --  35.4*  PLT 290  --   --  307  --  292  LABPROT  --  21.0*  --  19.7*  --   --   INR  --  1.79  --  1.65  --   --   CREATININE 0.94  --   --  0.94  --  0.91  TROPONINI 0.03* 0.03* <0.03  --  <0.03  --     Estimated Creatinine Clearance: 128.8 mL/min (by C-G formula based on SCr of 0.91 mg/dL).   Medical History: Past Medical History:  Diagnosis Date  . Asthma   . Atrial fibrillation ([place])   . Blood in urine   . Cancer ([place])   . Cellulitis of right lower extremity   . COPD (chronic obstructive pulmonary disease) ([place])   . Diabetes mellitus ([place])   . 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 MI (myocardial infarction) ([place])   . Peripheral edema   . Right leg DVT ([facility])     Medications:  Prescriptions Prior to Admission  Medication  Sig Dispense Refill Last Dose  . Artificial Tear Ointment (DRY EYES OP) Apply 1 drop to eye daily as needed (dry eyes).   Past Week at Unknown time  . atorvastatin (LIPITOR) 20 MG tablet Take 20 mg by mouth daily at 6 PM.   12/22/2016 at 1900  . budesonide-formoterol (SYMBICORT) 160-4.5 MCG/ACT inhaler Inhale 2 puffs into the lungs 2 (two) times daily.   12/22/2016 at Unknown time  . furosemide (LASIX) 40 MG tablet Take 40 mg by mouth daily.   12/23/2016 at 0830  . glipiZIDE (GLUCOTROL XL) 10 MG 24 hr tablet Take 1 tablet by mouth daily.  2 12/23/2016 at 0830  . HYDROcodone-acetaminophen (NORCO/VICODIN) 5-325 MG per tablet Take 1-2 tablets by mouth every 6 (six) hours as needed for moderate pain.   12/23/2016 at 0830  . metFORMIN (GLUCOPHAGE-XR) 500 MG 24 hr tablet Take 1 tablet by mouth 2 (two) times daily.  3 12/23/2016 at 0830  . omeprazole (PRILOSEC) 20 MG capsule Take 1 capsule by mouth daily.   12/22/2016 at Unknown time  . potassium chloride (K-DUR) 10 MEQ tablet Take 10 mEq by mouth daily.   12/23/2016 at 0830  . rivaroxaban (XARELTO) 20 MG  TABS tablet Take 20 mg by mouth daily.   12/23/2016 at 0830  . metolazone (ZAROXOLYN) 5 MG tablet Take 1 tablet by mouth 3 (three) times a week. Take on Monday, Wednesday, and Friday.  3 12/21/2016    Assessment: 61 y.o. male with medical history significant of CHF, COPD, morbid obesity, multiple DVT/PE and recent admission (6/12-13) for acute on chronic CHF and afib with RVR for which he left AMA. He returns with similar symptoms, pain in both legs. Hematology on 6/12 recommended to stop Xarelto and change to Coumadin with Lovenox bridge. The transition was not made when he left AMA. Will transition now. Last dose of Xarelto administered was this AM at 0900. Will start Lovenox and Coumadin tomorrow.  Start INR monitoring 2 days after stopping rivaroxaban (INR values drawn sooner may be falsely elevated by rivaroxaban). Hematology recommended target INR 2.5-3.5 with LMWH.    Goal of Therapy:  INR 2.5-3.5  Monitor platelets by anticoagulation protocol: Yes   Plan:  Lovenox 1mg /kg(140mg ) sq every 12 hours 7/4 Coumadin on 7/4 PT-INR 7/6 Educate on lovenox and coumadin Monitor for S/S of bleeding  Isac Sarna, BS Vena [place], BCPS Clinical Pharmacist Pager [phone] 12/25/2016,12:43 PM

## 2016-12-26 ENCOUNTER — Encounter (HOSPITAL_COMMUNITY): Payer: Self-pay | Admitting: Primary Care

## 2016-12-26 DIAGNOSIS — D509 Iron deficiency anemia, unspecified: Secondary | ICD-10-CM

## 2016-12-26 DIAGNOSIS — Z515 Encounter for palliative care: Secondary | ICD-10-CM

## 2016-12-26 DIAGNOSIS — I5021 Acute systolic (congestive) heart failure: Secondary | ICD-10-CM

## 2016-12-26 DIAGNOSIS — I872 Venous insufficiency (chronic) (peripheral): Secondary | ICD-10-CM

## 2016-12-26 DIAGNOSIS — I4891 Unspecified atrial fibrillation: Principal | ICD-10-CM

## 2016-12-26 DIAGNOSIS — Z7189 Other specified counseling: Secondary | ICD-10-CM

## 2016-12-26 DIAGNOSIS — I82431 Acute embolism and thrombosis of right popliteal vein: Secondary | ICD-10-CM

## 2016-12-26 LAB — BASIC METABOLIC PANEL
ANION GAP: 8 (ref 5–15)
BUN: 30 mg/dL — ABNORMAL HIGH (ref 6–20)
CHLORIDE: 83 mmol/L — AB (ref 101–111)
CO2: 37 mmol/L — AB (ref 22–32)
Calcium: 8.5 mg/dL — ABNORMAL LOW (ref 8.9–10.3)
Creatinine, Ser: 1.08 mg/dL (ref 0.61–1.24)
GFR calc non Af Amer: >60 mL/min (ref >60)
Glucose, Bld: 142 mg/dL — ABNORMAL HIGH (ref 65–99)
Potassium: 3.3 mmol/L — ABNORMAL LOW (ref 3.5–5.1)
Sodium: 128 mmol/L — ABNORMAL LOW (ref 135–145)

## 2016-12-26 LAB — GLUCOSE, CAPILLARY
GLUCOSE-CAPILLARY: 158 mg/dL — AB (ref 65–99)
GLUCOSE-CAPILLARY: 123 mg/dL — AB (ref 65–99)
GLUCOSE-CAPILLARY: 156 mg/dL — AB (ref 65–99)
Glucose-Capillary: 143 mg/dL — ABNORMAL HIGH (ref 65–99)

## 2016-12-26 MED ORDER — OXYCODONE-ACETAMINOPHEN 5-325 MG PO TABS
1.0000 | ORAL_TABLET | Freq: Four times a day (QID) | ORAL | Status: DC | PRN
  Administered 2016-12-26 – 2016-12-27 (×3): 2 via ORAL
  Filled 2016-12-26 (×3): qty 2

## 2016-12-26 MED ORDER — WARFARIN VIDEO: Freq: Once | Status: DC

## 2016-12-26 MED ORDER — WARFARIN - PHARMACIST DOSING INPATIENT
Status: DC
  Administered 2016-12-26: 1

## 2016-12-26 MED ORDER — WARFARIN SODIUM 5 MG PO TABS
5.0000 mg | ORAL_TABLET | Freq: Once | ORAL | Status: AC
  Administered 2016-12-26: 5 mg via ORAL
  Filled 2016-12-26: qty 1

## 2016-12-26 MED ORDER — IPRATROPIUM-ALBUTEROL 0.5-2.5 (3) MG/3ML IN SOLN
3.0000 mL | Freq: Four times a day (QID) | RESPIRATORY_TRACT | Status: DC | PRN
  Administered 2016-12-26: 3 mL via RESPIRATORY_TRACT
  Filled 2016-12-26: qty 3

## 2016-12-26 MED ORDER — OXYCODONE HCL 5 MG PO TABS: 5.0000 mg | ORAL_TABLET | Freq: Four times a day (QID) | ORAL | Status: DC | PRN

## 2016-12-26 MED ORDER — METOPROLOL TARTRATE 5 MG/5ML IV SOLN
5.0000 mg | Freq: Four times a day (QID) | INTRAVENOUS | Status: DC
  Administered 2016-12-26: 5 mg via INTRAVENOUS
  Filled 2016-12-26: qty 5

## 2016-12-26 NOTE — Progress Notes (Signed)
Pt continues to refuse Bipap. Encouraged pt to wear Bipap due to O2 saturations dropping when he drifts off to sleep but pt continues to refuse. Bipap is on standby at bedside.

## 2016-12-26 NOTE — Consult Note (Signed)
Consultation Note Date: 12/26/2016   Patient Name: Keith Kelly  DOB: 04/24/1956  MRN: [id number]  Age / Sex: 61 y.o., male  PCP: Keith Housekeeper, MD Referring Physician: Samuella Cota, MD  Reason for Consultation: Establishing goals of care and Psychosocial/spiritual support  HPI/Patient Profile: 61 y.o. male  with past medical history of CHF (EFF of 20 to 25% on 7/2), COPD, morbid obesity, multiple DVT/PE with recent admissions 6/12 through 13 for acute on chronic heart failure and a fib with RVR (he left AMA), history of face and neck cancer removed approximately 5 years ago admitted on 12/23/2016 with a fib with RVR, stasis dermatitis and chronic bilateral lower extremity wounds.   Clinical Assessment and Goals of Care: Keith Kelly is sitting quietly in bed with his future in front of him. He is very hard of hearing and at one point states that he wishes he could see me. He shares that he has trouble seeing and hearing and that it "makes you angry". We talk about his functional status prior to admission. He shares that his significant other "Keith Kelly" does the dressing changes for his legs, helps them with bathing and dressing, gets groceries and cooks food, and manages the finances. Keith Kelly states that he used to work at [employer] but was laid off when they closed. He shares that he also has a history of face and throat cancer that was written removed about 5 years ago. We talk about our current treatment plan. I share that we are continuing to treat him, but I'm worried about his symptom management. I share that we cannot change his chronic illness progression.   We talk about services at home, he shares that he has had home health in the past. I talk about the benefits of in home hospice. [name] states his former wife, Keith Kelly, used to work for hospice I share that I'm not sure that he  would be accepted, but if he were at hospice would make sure his wounds were managed, that he had medicine for pain and anxiety, and support at home. I share that we will talk more about choices tomorrow. No questions or concerns at this time.  Healthcare power of attorney NEXT OF KIN - Keith Kelly states that he would like for his significant other living in [place] to be his Ambulance person. He is not completed paperwork. He states his son, Keith Kelly can also make decisions if he is unable.   SUMMARY OF RECOMMENDATIONS   at this point continue to treat the treatable but no extraordinary measures such as CPR or intubation we discussed the benefits of hospice in home services, but he has not made a decision about care when he leaves the hospital.  Code Status/Advance Care Planning:  DNR  Symptom Management:   per hospitalist, no additional needs at this time.  Palliative Prophylaxis:   Palliative Wound Care and Turn Reposition  Additional Recommendations (Limitations, Scope, Preferences):  Continue to treat the treatable but no CPR or intubation.  Continue to discuss the benefits of hospice at home  Psycho-social/Spiritual:   Desire for further Chaplaincy support:no  Additional Recommendations: Caregiving  Support/Resources and Education on Hospice  Prognosis:   < 6 months, and would not be surprising based on functional decline, recurrent hospitalizations, continued smoking, chronic leg wounds, EF of 20 to 25%  Discharge Planning: To Be Determined      Primary Diagnoses: Present on Admission: . Atrial fibrillation with RVR ([place]) . DVT (deep venous thrombosis) ([place]) . Stasis dermatitis of both legs . Tobacco dependence . Iron deficiency anemia   I have reviewed the medical record, interviewed the patient and family, and examined the patient. The following aspects are pertinent.  Past Medical History:  Diagnosis Date  . Asthma   . Atrial fibrillation  ([place])   . Blood in urine   . Cancer ([place])   . Cellulitis of right lower extremity   . COPD (chronic obstructive pulmonary disease) ([place])   . Diabetes mellitus ([place])   . 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 MI (myocardial infarction) ([place])   . Peripheral edema   . Right leg DVT [facility])    Social History   Social History  . Marital status: Divorced    Spouse name: N/A  . Number of children: N/A  . Years of education: N/A   Occupational History  . disabled    Social History Main Topics  . Smoking status: Current Every Day Smoker    Packs/day: 0.50    Years: 45.00    Types: Cigarettes    Start date: 04/07/1972  . Smokeless tobacco: Never Used  . Alcohol use No  . Drug use: No  . Sexual activity: Not Asked   Other Topics Concern  . None   Social History Narrative  . None   Family History  Problem Relation Age of Onset  . COPD Father   . Deep vein thrombosis Mother    Scheduled Meds: . aspirin  81 mg Oral Daily  . atorvastatin  20 mg Oral q1800  . enoxaparin (LOVENOX) injection  140 mg Subcutaneous Q12H  . ferrous sulfate  325 mg Oral BID WC  . furosemide  40 mg Intravenous Q12H  . insulin aspart  0-15 Units Subcutaneous TID WC  . lisinopril  10 mg Oral Daily  . metolazone  5 mg Oral Once per day on Mon Wed Fri  . metoprolol succinate  12.5 mg Oral BID  . mometasone-formoterol  2 puff Inhalation BID  . nicotine  14 mg Transdermal Daily  . pantoprazole  40 mg Oral Daily  . potassium chloride  10 mEq Oral Daily  . sodium chloride flush  3 mL Intravenous Q12H  . triamcinolone 0.1 % cream : eucerin   Topical BID  . warfarin  5 mg Oral Once  . [START ON 12/27/2016] warfarin   Does not apply Once  . Warfarin - Pharmacist Dosing Inpatient   Does not apply Q24H   Continuous Infusions: . sodium chloride     PRN Meds:.sodium chloride, acetaminophen, HYDROcodone-acetaminophen, ipratropium-albuterol, ondansetron (ZOFRAN) IV, sodium  chloride flush Medications Prior to Admission:  Prior to Admission medications   Medication Sig Start Date End Date Taking? Authorizing Provider  Artificial Tear Ointment (DRY EYES OP) Apply 1 drop to eye daily as needed (dry eyes).   Yes [provider]  atorvastatin (LIPITOR) 20 MG tablet Take 20 mg by mouth daily at 6 PM.   Yes [provider]  budesonide-formoterol (SYMBICORT) 160-4.5 MCG/ACT inhaler Inhale 2 puffs into the lungs 2 (two) times daily.   Yes [provider]  furosemide (LASIX) 40 MG tablet Take 40 mg by mouth daily.   Yes [provider]  glipiZIDE (GLUCOTROL XL) 10 MG 24 hr tablet Take 1 tablet by mouth daily. 10/14/16  Yes [provider]  HYDROcodone-acetaminophen (NORCO/VICODIN) 5-325 MG per tablet Take 1-2 tablets by mouth every 6 (six) hours as needed for moderate pain.   Yes [provider]  metFORMIN (GLUCOPHAGE-XR) 500 MG 24 hr tablet Take 1 tablet by mouth 2 (two) times daily. 10/06/16  Yes [provider]  omeprazole (PRILOSEC) 20 MG capsule Take 1 capsule by mouth daily. 03/21/15  Yes [provider]  potassium chloride (K-DUR) 10 MEQ tablet Take 10 mEq by mouth daily.   Yes [provider]  rivaroxaban (XARELTO) 20 MG TABS tablet Take 20 mg by mouth daily. 04/08/14 12/23/16 Yes [provider]  metolazone (ZAROXOLYN) 5 MG tablet Take 1 tablet by mouth 3 (three) times a week. Take on Monday, Wednesday, and Friday. 11/20/16   [provider]   Allergies  Allergen Reactions  . Meloxicam Other (See Comments)    Other reaction(s): Unknown UNKNOWN  . Penicillins Hives    Has patient had a PCN reaction causing immediate rash, facial/tongue/throat swelling, SOB or lightheadedness with hypotension: no Has patient had a PCN reaction causing severe rash involving mucus membranes or skin necrosis: no Has patient had a PCN reaction that required hospitalization: no Has patient had  a PCN reaction occurring within the last 10 years: no If all of the above answers are "NO", then may proceed with Cephalosporin use.    Review of Systems  Unable to perform ROS: Acuity of condition    Physical Exam  Constitutional: No distress.  HENT:  Head: Atraumatic.  Cardiovascular: Normal rate.   Pulmonary/Chest: Effort normal. No respiratory distress.  Abdominal: Soft. He exhibits distension.  Obese abdomen  Musculoskeletal: He exhibits edema.  Neurological: He is alert.  Skin: Skin is warm and dry.  Bilateral lower extremity leg wounds wrapped in [place] note and vitals reviewed.   Vital Signs: BP (!) 87/70   Pulse (!) 119   Temp 98.4 F (36.9 C) (Oral)   Resp (!) 26   Ht 6\' 3"  (1.905 m)   Wt (!) 141.1 kg (311 lb 1.1 oz)   SpO2 (!) 79%   BMI 38.88 kg/m  Pain Assessment: 0-10   Pain Score: 10-Worst pain ever   SpO2: SpO2: (!) 79 % O2 Device:SpO2: (!) 79 % O2 Flow Rate: .O2 Flow Rate (L/min): 14 L/min  IO: Intake/output summary:  Intake/Output Summary (Last 24 hours) at 12/26/16 1257 Last data filed at 12/26/16 0500  Gross per 24 hour  Intake                0 ml  Output              500 ml  Net             -500 ml    LBM: Last BM Date: 12/23/16 Baseline Weight: Weight: (!) 142.4 kg (314 lb) Most recent weight: Weight: (!) 141.1 kg (311 lb 1.1 oz)     Palliative Assessment/Data:   Flowsheet Rows     Most Recent Value  Intake Tab  Referral Department  Hospitalist  Unit at Time of Referral  ICU  Palliative Care Primary Diagnosis  Cardiac  Date Notified  12/26/16  Palliative Care Type  New Palliative care  Reason for referral  Clarify Goals of Care  Date of Admission  12/23/16  Date first seen by Palliative Care  12/26/16  # of days Palliative referral response time  0 Day(s)  # of days IP prior to Palliative referral  3  Clinical Assessment  Palliative Performance Scale Score  30%  Pain Max last 24 hours  Not able to report  Pain Min  Last 24 hours  Not able to report  Dyspnea Max Last 24 Hours  Not able to report  Dyspnea Min Last 24 hours  Not able to report  Psychosocial & Spiritual Assessment  Palliative Care Outcomes  Patient/Family meeting held?  Yes  Who was at the meeting?  Patient only today  Palliative Care Outcomes  Clarified goals of care, Provided psychosocial or spiritual support  Patient/Family wishes: Interventions discontinued/not started   Mechanical Ventilation      Time In: 1130 Time Out: 1205 Time Total: 35 minutes Greater than 50%  of this time was spent counseling and coordinating care related to the above assessment and plan.  Signed by: Drue Novel, NP   Please contact Palliative Medicine Team phone at [phone] for questions and concerns.  For individual provider: See Shea Evans

## 2016-12-26 NOTE — Progress Notes (Signed)
Wound care completed this shift to venous ulcers to BLE. Minimal drainage noted to removed drsgs, BLE washed with mild soap and water, patted dry and wrapped in Xeroform, kerlix and ACE bandages as ordered. Heel foams applied to bilateral heels. Patient tolerated well.

## 2016-12-26 NOTE — Progress Notes (Signed)
PT Cancellation Note  Patient Details Name: Keith Kelly MRN: [id number] DOB: Jan 25, 1956   Cancelled Treatment:    Reason Eval/Treat Not Completed: Patient not medically ready .  Pt agreeable to treatment.  Upon entering room O2 was 92; with 5 heel slides O2 went down to 78. With rest O2 increased back to 92.  Attempted to breath with exercise but O2 decreased back to the 70's .  Will attempt tomorrow.  Keith Kelly, [place] CLT [phone] 12/26/2016, 11:21 AM

## 2016-12-26 NOTE — Progress Notes (Signed)
Made several attempts to contact staff education line ext [phone] to order Warfarin video for patient to watch as ordered w/o success. Video rescheduled for tomorrow.

## 2016-12-26 NOTE — Progress Notes (Signed)
ANTICOAGULATION CONSULT NOTE - follow up  Pharmacy Consult for Lovenox and Coumadin Indication: DVT and Afib  Allergies  Allergen Reactions  . Meloxicam Other (See Comments)    Other reaction(s): Unknown UNKNOWN  . Penicillins Hives    Has patient had a PCN reaction causing immediate rash, facial/tongue/throat swelling, SOB or lightheadedness with hypotension: no Has patient had a PCN reaction causing severe rash involving mucus membranes or skin necrosis: no Has patient had a PCN reaction that required hospitalization: no Has patient had a PCN reaction occurring within the last 10 years: no If all of the above answers are "NO", then may proceed with Cephalosporin use.    Patient Measurements: Height: 6\' 3"  (190.5 cm) Weight: (!) 311 lb 1.1 oz (141.1 kg) IBW/kg (Calculated) : 84.5  Vital Signs: Temp: 97.8 F (36.6 C) (07/04 0400) Temp Source: Oral (07/04 0400) BP: 85/61 (07/04 0819) Pulse Rate: 103 (07/04 0819)  Labs:  Recent Labs  12/23/16 1540 12/23/16 2102 12/24/16 0427 12/24/16 0428 12/24/16 0917 12/25/16 0601 12/26/16 0616  HGB 10.9*  --   --  10.6*  --  10.5*  --   HCT 36.1*  --   --  35.9*  --  35.4*  --   PLT 290  --   --  307  --  292  --   LABPROT  --  21.0*  --  19.7*  --   --   --   INR  --  1.79  --  1.65  --   --   --   CREATININE 0.94  --   --  0.94  --  0.91 1.08  TROPONINI 0.03* 0.03* <0.03  --  <0.03  --   --    Estimated Creatinine Clearance: 108.8 mL/min (by C-G formula based on SCr of 1.08 mg/dL).  Medical History: Past Medical History:  Diagnosis Date  . Asthma   . Atrial fibrillation ([place])   . Blood in urine   . Cancer ([place])   . Cellulitis of right lower extremity   . COPD (chronic obstructive pulmonary disease) ([place])   . Diabetes mellitus ([place])   . 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 MI (myocardial infarction) ([place])   . Peripheral edema   . Right leg DVT ([facility])    Medications:   Prescriptions Prior to Admission  Medication Sig Dispense Refill Last Dose  . Artificial Tear Ointment (DRY EYES OP) Apply 1 drop to eye daily as needed (dry eyes).   Past Week at Unknown time  . atorvastatin (LIPITOR) 20 MG tablet Take 20 mg by mouth daily at 6 PM.   12/22/2016 at 1900  . budesonide-formoterol (SYMBICORT) 160-4.5 MCG/ACT inhaler Inhale 2 puffs into the lungs 2 (two) times daily.   12/22/2016 at Unknown time  . furosemide (LASIX) 40 MG tablet Take 40 mg by mouth daily.   12/23/2016 at 0830  . glipiZIDE (GLUCOTROL XL) 10 MG 24 hr tablet Take 1 tablet by mouth daily.  2 12/23/2016 at 0830  . HYDROcodone-acetaminophen (NORCO/VICODIN) 5-325 MG per tablet Take 1-2 tablets by mouth every 6 (six) hours as needed for moderate pain.   12/23/2016 at 0830  . metFORMIN (GLUCOPHAGE-XR) 500 MG 24 hr tablet Take 1 tablet by mouth 2 (two) times daily.  3 12/23/2016 at 0830  . omeprazole (PRILOSEC) 20 MG capsule Take 1 capsule by mouth daily.   12/22/2016 at Unknown time  . potassium chloride (K-DUR) 10 MEQ tablet  Take 10 mEq by mouth daily.   12/23/2016 at 0830  . rivaroxaban (XARELTO) 20 MG TABS tablet Take 20 mg by mouth daily.   12/23/2016 at 0830  . metolazone (ZAROXOLYN) 5 MG tablet Take 1 tablet by mouth 3 (three) times a week. Take on Monday, Wednesday, and Friday.  3 12/21/2016   Assessment: 61 y.o. male with medical history significant of CHF, COPD, morbid obesity, multiple DVT/PE and recent admission (6/12-13) for acute on chronic CHF and afib with RVR for which he left AMA. He returns with similar symptoms, pain in both legs. Hematology on 6/12 recommended to stop Xarelto and change to Coumadin with Lovenox bridge. The transition was not made when he left AMA. Will transition now. Last dose of Xarelto administered was 12/25/16 at 0900. Will start Lovenox and Coumadin on 12/26/16.  Note that INR values drawn after Xarelto doses may be falsely elevated by rivaroxaban - will need to evaluate INR's drawn 48-72  hours after last dose for more accurate eval). Hematology recommended target INR 2.5-3.5 with LMWH.   Goal of Therapy:  INR 2.5-3.5  Monitor platelets by anticoagulation protocol: Yes   Plan:  Lovenox 1mg /kg(140mg ) sq every 12 hours 7/4 Coumadin 5mg  x 1 today  PT-INR 7/6 Educate on lovenox and coumadin Monitor for S/S of bleeding  Hart Robinsons, PharmD Clinical Pharmacist Pager:  [phone] 12/26/2016   12/26/2016,8:36 AM

## 2016-12-26 NOTE — Progress Notes (Signed)
PROGRESS NOTE  TRUST LEH BJS:[id number] DOB: 1955-08-13 DOA: 12/23/2016 PCP: Dione Housekeeper, MD  Brief Narrative: 61 year old man PMH COPD, CHF, morbid obesity, multiple DVT/PE, atrial fibrillation presented with right lower extremity pain. Admitted for atrial fibrillation with rapid ventricular response, found to have new diagnosis of severe systolic congestive heart failure, evaluated by cardiology.  Assessment/Plan #1: Atrial fibrillation with rapid ventricular response. Initially controlled with Cardizem infusion, however low ejection fraction noted, change to metoprolol. -BP borderline low, likely related to rapid heart rate. He is asymptomatic.  -Continue metoprolol, warfarin  #2: Acute on chronic systolic CHF, -3.5 L since admission.  -Appears to be improving clinically. Continue IV diuresis as blood pressure tolerates.  #3: Acute/chronic hypoxic respiratory failure. Asymptomatic. On high flow nasal cannula with borderline oxygen saturation. Suspect this is a chronic issue given long-term smoking, morbid obesity with suspected hypoventilation syndrome, complicated by noncompliance with therapy for obstructive sleep apnea. -Continue oxygen supplementation. Wean as tolerated. Suspect will need chronic oxygen therapy indefinitely. -Continue to recommend BiPAP at night -Continue to recommend smoking cessation -Suggest outpatient follow-up with pulmonology  #4: Multiple DVT, PE. Hematology recommended warfarin with goal INR 2.5-3.5. Needs follow-up at [facility] as an outpatient follow-up hypercoagulability panel as well as ongoing management of PT/INR. -We will discuss again with hematology 7/5. Patient demonstrates noncompliance both inpatient and as an outpatient. I'm skeptical that he will comply with warfarin therapy and follow-up as an outpatient.  -Continue warfarin for now as per pharmacy  #5: Bilateral lower extremity venous stasis changes with chronic  wounds, Multiple wounds, deep tissue injury to the left heel present on admission, incontinence associated dermatitis, venous insufficiency. Per wound care: Cleanse bilateral lower legs with soap and water and pat dry.  Cover wounds with Xeroform gauze, ABD pad. Wrap with  kerlix from base of toes to below knee and cover with ace wrap for minimal compression. Cleanse perineal skin with soap and water and apply barrier cream twice daily.  #6: diabetes mellitus type 2,  -Stable. Continue sling scale insulin.-  Morbid obesity  Cigarette smoker  Iron deficiency anemia, stable. Follow-up as an outpatient.  Noncompliance, poor hygiene, self-neglect. Seen on most recent hospitalization, documented by outpatient primary care physician as well as nursing during this hospitalization. Refusing some medications, refusing BiPAP.   Patient talked of leaving AMA. After discussion, I clearly explained the risks of leaving his medical advice including death and the patient understood clearly and has agreed stay at this point.  Complex case, prognosis poor. Consult palliative medicine 7/5.  DVT prophylaxis: enoxaparin, warfarin Code Status: DNR Family Communication fiancee  Disposition Plan: home    Murray Hodgkins, MD  [facility] Direct contact: [phone] --[place] [place]  --[email or web address]; password [email or web address]  7PM-7AM contact night coverage as above 12/26/2016, 9:32 AM  LOS: 2 days   Consultants: Cardiology   Procedures:  Echo Study Conclusions  - Left ventricle: The cavity size was moderately to severely   dilated. Wall thickness was increased in a pattern of moderate   LVH. Systolic function was severely reduced. The estimated   ejection fraction was in the range of 20% to 25%. The study is   not technically sufficient to allow evaluation of LV diastolic   function. - Aortic valve: Mildly calcified annulus. Trileaflet; mildly   thickened leaflets. There was mild to moderate  regurgitation.   Valve area (VTI): 1.67 cm^2. Valve area (Vmax): 1.67 cm^2. - Mitral valve: Mildly calcified annulus. Mildly thickened leaflets   .  There was mild regurgitation. - Left atrium: The atrium was mildly dilated. - Right ventricle: The cavity size was moderately dilated. - Right atrium: The atrium was mildly dilated. - Atrial septum: No defect or patent foramen ovale was identified. - Pulmonary arteries: Systolic pressure was mildly increased. PA   peak pressure: 38 mm Hg (S). - Technically difficult study.  Antimicrobials:    Interval history/Subjective: Feels fine, no pain, no shortness of breath, no complaints. Wants to go home.  Objective: Vitals: Afebrile, temperature 98.3, respirations 25, heart rate 138, SGPT 80-96, SPO2 86-92% on high flow nasal cannula.  Exam:     Constitutional: Appears chronically ill, disheveled, nontoxic  Eyes: Pupils, irises, lids appear unremarkable  ENT: Hard of hearing, hears best on the right side, tongue appears unremarkable. Chronic surgical deformity left side of the mouth.  Neck: No lymphadenopathy or masses. No thyromegaly.  Cardiovascular: Tachycardic, irregular. No murmur, rub or gallop. 3+ bilateral lower 70 edema.  Respiratory: Clear to auscultation bilaterally. No wheezes, rales or rhonchi. Normal respiratory effort. Speaks in full sentences.  Abdomen: Obese, soft, nontender  Skin: Lower extremity wounds have been dressed.  Psychiatric: Alert, speech slightly dysarthric but clear. Discussed in detail risks and benefits of leaving the hospital, patient appears to clearly understand.   I have personally reviewed the following:  Urine output 1250. -3.5 L since admission. Labs:  Potassium 3.3, sodium 128, creatinine within normal limits, BUN rising to 30  Imaging studies:    Medical tests:     Test discussed with performing physician:    Decision to obtain old records:    Review and summation of old  records:  Admitted 6/12 for acute on chronic CHF with massive volume overload, atrial fibrillation with rapid ventricular response, acute DVT, diabetes mellitus type 2. Patient was seen by hematology with recommendation for discontinuation of Xarelto, initiation of warfarin. Patient left AMA. According documentation, the nurse, hospitalist oncologist all informed the patient of the need to stay in the hospital for multiple reasons including hypoxia.  6/21 PCP office visit to follow-up hospitalization, impression was acute on chronic heart failure with recommendation to adjust diuretics. Diabetes noted to be poorly controlled but the patient refused other medications. Restarted on Xarelto given compliance issues.  6/28 cardiology office visit: Follow-up CHF, recommendation to continue diuretics.  Scheduled Meds: . aspirin  81 mg Oral Daily  . atorvastatin  20 mg Oral q1800  . enoxaparin (LOVENOX) injection  140 mg Subcutaneous Q12H  . ferrous sulfate  325 mg Oral BID WC  . furosemide  40 mg Intravenous Q12H  . insulin aspart  0-15 Units Subcutaneous TID WC  . lisinopril  10 mg Oral Daily  . metolazone  5 mg Oral Once per day on Mon Wed Fri  . metoprolol succinate  12.5 mg Oral BID  . mometasone-formoterol  2 puff Inhalation BID  . nicotine  14 mg Transdermal Daily  . pantoprazole  40 mg Oral Daily  . potassium chloride  10 mEq Oral Daily  . sodium chloride flush  3 mL Intravenous Q12H  . triamcinolone 0.1 % cream : eucerin   Topical BID  . warfarin  5 mg Oral Once  . warfarin   Does not apply Once  . Warfarin - Pharmacist Dosing Inpatient   Does not apply Q24H   Continuous Infusions: . sodium chloride      Principal Problem:   Atrial fibrillation with RVR (HCC) Active Problems:   DVT (deep venous thrombosis) ([place])  DM type 2 (diabetes mellitus, type 2) (HCC)   Stasis dermatitis of both legs   Tobacco dependence   Iron deficiency anemia   Acute systolic CHF (congestive heart  failure) ([place])   LOS: 2 days

## 2016-12-26 NOTE — Progress Notes (Signed)
Pt is refusing to wear Bipap.  

## 2016-12-27 ENCOUNTER — Encounter (HOSPITAL_COMMUNITY): Payer: Medicaid Other

## 2016-12-27 DIAGNOSIS — Z7189 Other specified counseling: Secondary | ICD-10-CM

## 2016-12-27 DIAGNOSIS — Z9119 Patient's noncompliance with other medical treatment and regimen: Secondary | ICD-10-CM

## 2016-12-27 DIAGNOSIS — F17200 Nicotine dependence, unspecified, uncomplicated: Secondary | ICD-10-CM

## 2016-12-27 LAB — PROTIME-INR
INR: 1.31
PROTHROMBIN TIME: 16.4 s — AB (ref 11.4–15.2)

## 2016-12-27 LAB — CBC
HEMATOCRIT: 34.2 % — AB (ref 39.0–52.0)
HEMOGLOBIN: 10.5 g/dL — AB (ref 13.0–17.0)
MCH: 22.3 pg — AB (ref 26.0–34.0)
MCHC: 30.7 g/dL (ref 30.0–36.0)
MCV: 72.8 fL — ABNORMAL LOW (ref 78.0–100.0)
Platelets: 257 10*3/uL (ref 150–400)
RBC: 4.7 MIL/uL (ref 4.22–5.81)
RDW: 18 % — ABNORMAL HIGH (ref 11.5–15.5)
WBC: 15.9 10*3/uL — ABNORMAL HIGH (ref 4.0–10.5)

## 2016-12-27 LAB — GLUCOSE, CAPILLARY
GLUCOSE-CAPILLARY: 107 mg/dL — AB (ref 65–99)
GLUCOSE-CAPILLARY: 141 mg/dL — AB (ref 65–99)
Glucose-Capillary: 134 mg/dL — ABNORMAL HIGH (ref 65–99)

## 2016-12-27 MED ORDER — IPRATROPIUM-ALBUTEROL 0.5-2.5 (3) MG/3ML IN SOLN: 3.0000 mL | Freq: Four times a day (QID) | RESPIRATORY_TRACT | 0 refills | Status: AC | PRN

## 2016-12-27 MED ORDER — FERROUS SULFATE 325 (65 FE) MG PO TABS: 325.0000 mg | ORAL_TABLET | Freq: Two times a day (BID) | ORAL | Status: AC

## 2016-12-27 MED ORDER — FUROSEMIDE 40 MG PO TABS: 40.0000 mg | ORAL_TABLET | Freq: Two times a day (BID) | ORAL | 0 refills | Status: AC

## 2016-12-27 MED ORDER — METOPROLOL SUCCINATE ER 25 MG PO TB24: 12.5000 mg | ORAL_TABLET | Freq: Every day | ORAL | 0 refills | Status: AC

## 2016-12-27 MED ORDER — METOPROLOL SUCCINATE ER 25 MG PO TB24: 12.5000 mg | ORAL_TABLET | Freq: Two times a day (BID) | ORAL | Status: DC

## 2016-12-27 MED ORDER — RIVAROXABAN 20 MG PO TABS
20.0000 mg | ORAL_TABLET | Freq: Every day | ORAL | Status: DC
  Administered 2016-12-27: 20 mg via ORAL
  Filled 2016-12-27: qty 1

## 2016-12-27 NOTE — Progress Notes (Signed)
PROGRESS NOTE  Keith Kelly BZJ:[id number] DOB: [birth year] DOA: 12/23/2016 PCP: Dione Housekeeper, MD  Brief Narrative: 61 year old man PMH COPD, CHF, morbid obesity, multiple DVT/PE, atrial fibrillation presented with right lower extremity pain. Admitted for atrial fibrillation with rapid ventricular response, found to have new diagnosis of severe systolic congestive heart failure, evaluated by cardiology.  Assessment/Plan #1: Atrial fibrillation with rapid ventricular response. Blood pressures remained borderline low abdominal he has been completely asymptomatic. Cardiology recommended metoprolol given systolic dysfunction. Heart rate is somewhat labile but generally stable in the low 100s as long as oxygenation is adequate. -Metoprolol succinate daily on discharge (cardiology recommendations noted, however the patient's heart rate has been fairly well controlled without full dose metoprolol).  #2: Acute on chronic systolic CHF, - 5.9 L since admission. -Overall appears much improved with decreasing volume overload. We'll continue diuretic on discharge. -Continue beta blocker. -No ACE inhibitor or spironolactone given borderline low blood pressure.  #3: Acute/chronic hypoxic respiratory failure. Multifactorial, suspect a chronic component long-standing. Etiologies include COPD, long-term smoking, morbid obesity, suspected hypoventilation syndrome, heart failure, noncompliance with therapy for obstructive sleep apnea.  -Improved with use of BiPAP at night. Oxygenation in the 90s on high flow nasal cannula at 9 L at this point. -Recommend smoking cessation -Appreciate pulmonology consultation. At this point his lungs sound fairly clear did not hear discrete wheezing and do not think he would benefit from steroids at this point. Recommend outpatient follow-up with pulmonology. -Continue Symbicort as well as nebulizer therapy at home  #4: Multiple DVT, PE. Notes reviewed in detail. Hematology  recommendations noted. I also noted his primary care physician's recommendations. These 2 recommendations are at odds. In ideal circumstances the patient would continue long-term warfarin. However, in this patient with demonstrated ongoing noncompliance both in and outpatient, the likelihood that he will comply with warfarin therapy, frequent INR checks and increased clinic visits is highly unlikely. The likelihood that he will benefit from warfarin therapy is low. Discussed with pharmacy, at this point think the best choice for this patient given a multitude of factors is to resume Xarelto as recommended by his primary care physician.  #5: Bilateral lower extremity venous stasis changes with chronic wounds, Multiple wounds, deep tissue injury to the left heel present on admission, incontinence associated dermatitis, venous insufficiency. Per wound care: Cleanse bilateral lower legs with soap and water and pat dry.  Cover wounds with Xeroform gauze, ABD pad. Wrap with  kerlix from base of toes to below knee and cover with ace wrap for minimal compression. Cleanse perineal skin with soap and water and apply barrier cream twice daily.  #6: diabetes mellitus type 2,  Remains stable. Not a candidate for metformin given his low ejection fraction. -Blood sugars have remained well controlled with minimal need for insulin. Will proceed with monotherapy with glipizide on discharge.  Morbid obesity  Cigarette smoker -Recommend cessation  Iron deficiency anemia, stable. Follow-up as an outpatient.  Noncompliance, poor hygiene, self-neglect. Seen on most recent hospitalization, documented by outpatient primary care physician as well as nursing during this hospitalization.    Discussed in detail with patient at the bedside this morning, he was adamant about leaving, he was able to clearly understand and verbalize the risks which include hypoxia and death. He is not willing to stay longer in the hospital even  if going home results in his death. He clearly has capacity. He was seen by palliative care, and the patient has elected to go home with hospice. Given  the patient's expressed wishes to be at home, this is the best option to provide quality of life and longevity. Plan for discharge home today with home oxygen high flow as well as BiPAP at night and as needed.  DVT prophylaxis: enoxaparin, warfarin Code Status: DNR Family Communication fiancee  Disposition Plan: home    Murray Hodgkins, MD  [facility] Direct contact: [phone] --[address]ia [place]  --[email or web address]; password [email or web address]  7PM-7AM contact night coverage as above 12/27/2016, 3:18 PM  LOS: 3 days   Consultants: Cardiology   Procedures:  Echo Study Conclusions  - Left ventricle: The cavity size was moderately to severely   dilated. Wall thickness was increased in a pattern of moderate   LVH. Systolic function was severely reduced. The estimated   ejection fraction was in the range of 20% to 25%. The study is   not technically sufficient to allow evaluation of LV diastolic   function. - Aortic valve: Mildly calcified annulus. Trileaflet; mildly   thickened leaflets. There was mild to moderate regurgitation.   Valve area (VTI): 1.67 cm^2. Valve area (Vmax): 1.67 cm^2. - Mitral valve: Mildly calcified annulus. Mildly thickened leaflets   . There was mild regurgitation. - Left atrium: The atrium was mildly dilated. - Right ventricle: The cavity size was moderately dilated. - Right atrium: The atrium was mildly dilated. - Atrial septum: No defect or patent foramen ovale was identified. - Pulmonary arteries: Systolic pressure was mildly increased. PA   peak pressure: 38 mm Hg (S). - Technically difficult study.  Antimicrobials:    Interval history/Subjective: Noncompliance, breathing fine. Insists on going home y. leg pain has much decreased. Nursing reports he improved after use of BiPAP last night. Oxygenation  improved.  Objective: Vitals: 98.1, 16, 122, 155/144, 96% on 9 L high flow nasal cannula   Exam:     Constitutional: Appears chronically ill, disheveled  Eyes: Pupils, irises, lids appear unremarkable.  Cardiovascular: Irregular, tachycardic, no murmur, rub or gallop. Decreased bilateral lower extremity edema 1-2 plus.  Respiratory: Clear to auscultation bilaterally. No wheezes, rales or rhonchi. Normal respiratory effort. Speaks in full sentences.  ENT: Hard of hearing, hears best on the right side.  Abdomen: Obese, soft, nontender  Skin: Bilateral lower extremity is wrapped.  Psychiatric: Answers questions appropriately, follows simple commands. Discussed in detail the risk/benefit of ongoing hospitalization the patient was able to state the risks and clearly has capacity.   I have personally reviewed the following:  Urine output 2400. -5.9 L since admission Labs:  Blood sugars stable.  Hemoglobin stable 10.5, remainder CBC notable only for WBC of 15.9.  INR 1.31  Imaging studies:    Medical tests:     Test discussed with performing physician:    Decision to obtain old records:    Review and summation of old records:  Admitted 6/12 for acute on chronic CHF with massive volume overload, atrial fibrillation with rapid ventricular response, acute DVT, diabetes mellitus type 2. Patient was seen by hematology with recommendation for discontinuation of Xarelto, initiation of warfarin. Patient left AMA. According documentation, the nurse, hospitalist oncologist all informed the patient of the need to stay in the hospital for multiple reasons including hypoxia.  6/21 PCP office visit to follow-up hospitalization, impression was acute on chronic heart failure with recommendation to adjust diuretics. Diabetes noted to be poorly controlled but the patient refused other medications. Restarted on Xarelto given compliance issues.  6/28 cardiology office visit: Follow-up  CHF, recommendation to continue diuretics.  Scheduled Meds: . aspirin  81 mg Oral Daily  . atorvastatin  20 mg Oral q1800  . ferrous sulfate  325 mg Oral BID WC  . furosemide  40 mg Intravenous Q12H  . insulin aspart  0-15 Units Subcutaneous TID WC  . lisinopril  10 mg Oral Daily  . metolazone  5 mg Oral Once per day on Mon Wed Fri  . metoprolol succinate  12.5 mg Oral BID  . mometasone-formoterol  2 puff Inhalation BID  . nicotine  14 mg Transdermal Daily  . pantoprazole  40 mg Oral Daily  . potassium chloride  10 mEq Oral Daily  . rivaroxaban  20 mg Oral Daily  . sodium chloride flush  3 mL Intravenous Q12H  . triamcinolone 0.1 % cream : eucerin   Topical BID   Continuous Infusions: . sodium chloride      Principal Problem:   Atrial fibrillation with RVR (HCC) Active Problems:   DVT (deep venous thrombosis) (HCC)   DM type 2 (diabetes mellitus, type 2) (HCC)   Stasis dermatitis of both legs   Tobacco dependence   Iron deficiency anemia   Acute systolic CHF (congestive heart failure) ([place])   Goals of care, counseling/discussion   Palliative care encounter   Encounter for hospice care discussion   LOS: 3 days

## 2016-12-27 NOTE — Discharge Summary (Signed)
Physician Discharge Summary  Keith Kelly CZY:[id number] DOB: 1956/05/12 DOA: 12/23/2016  PCP: Dione Housekeeper, MD  Admit date: 12/23/2016 Discharge date: 12/27/2016  Recommendations for Outpatient Follow-up:  1. Patient has elected home with hospice. 2. Diuretic therapy has been adjusted for systolic heart failure 3. Metoprolol has been added to assist with rate control for atrial fibrillation 4. See discussion below and regard to anticoagulation rationale 5. Ongoing wound care for bilateral lower show any wounds as below 6. Ongoing care for multiple comorbidities as below 7. Iron deficiency anemia, started on iron  Follow-up Information    Dione Housekeeper, MD. Schedule an appointment as soon as possible for a visit.   Specialty:  Family Medicine Contact information: [place] [id number] [phone]        Herminio Commons, MD. Schedule an appointment as soon as possible for a visit in 2 week(s).   Specialty:  Cardiology Contact information: [place] [address] [phone]            Discharge Diagnoses:  1. Atrial fibrillation with rapid ventricular response 2. Acute on chronic systolic congestive heart failure 3. Acute on chronic hypoxic respiratory failure 4. History of multiple DVT, PE 5. Bilateral lower extremity venous stasis changes with chronic wounds 6. Diabetes mellitus type 2 7. Morbid obesity 8. Cigarette smoker 9. Iron deficiency anemia 10. Noncompliance  Discharge Condition: Improved but prognosis is guarded, long-term prognosis is poor Disposition: Home with hospice  Diet recommendation: Heart healthy, diabetic diet  Filed Weights   12/25/16 0500 12/26/16 0400 12/27/16 0500  Weight: (!) 140.3 kg (309 lb 4.9 oz) (!) 141.1 kg (311 lb 1.1 oz) (!) 142.1 kg (313 lb 4.4 oz)    History of present illness:  61 year old man PMH COPD, CHF, morbid obesity, multiple DVT/PE, atrial fibrillation presented with  right lower extremity pain. Admitted for atrial fibrillation with rapid ventricular response, found to have new diagnosis of severe systolic congestive heart failure, evaluated by cardiology.  Hospital Course:  Patient was treated with rate control and gradually heart rate improved with treatment of heart failure and hypoxia. Heart failure and volume overload responded well to IV diuresis and respiratory status improved with high flow nasal cannula oxygen and BiPAP. Hospitalization was complicated by intermittent noncompliance with medical therapy. He eventually elected to go home with hospice. Individual issues as below.  #1: Atrial fibrillation with rapid ventricular response. Blood pressures remained borderline low although he has been completely asymptomatic. Cardiology recommended metoprolol given systolic dysfunction. Heart rate is somewhat labile but generally stable in the low 100s as long as oxygenation is adequate. -Metoprolol succinate daily on discharge (cardiology recommendations noted, however the patient's heart rate has been fairly well controlled without full dose metoprolol).  #2: Acute on chronic systolic CHF, - 5.9 L since admission. -Overall appears much improved with decreasing volume overload. We'll continue diuretic on discharge. -Continue beta blocker. -No ACE inhibitor or spironolactone given borderline low blood pressure.  #3: Acute/chronic hypoxic respiratory failure. Multifactorial, suspect a chronic component long-standing. Etiologies include COPD, long-term smoking, morbid obesity, suspected hypoventilation syndrome, heart failure, noncompliance with therapy for obstructive sleep apnea.  -Improved with use of BiPAP at night. Oxygenation in the 90s on high flow nasal cannula at 9 L at this point. -Recommend smoking cessation -Appreciate pulmonology consultation. At this point his lungs sound fairly clear did not hear discrete wheezing and do not think he would benefit  from steroids at this point. Recommend outpatient follow-up  with pulmonology. -Continue Symbicort as well as nebulizer therapy at home  #4: Multiple DVT, PE. Notes reviewed in detail. Hematology recommendations noted. I also noted his primary care physician's recommendations. These 2 recommendations are at odds. In ideal circumstances the patient would continue long-term warfarin. However, in this patient with demonstrated ongoing noncompliance both in and outpatient, the likelihood that he will comply with warfarin therapy, frequent INR checks and increased clinic visits is highly unlikely. The likelihood that he will benefit from warfarin therapy is low. It is not clear that he was compliant with Xarelto or that his most recent acute DVT should be considered a treatment failure. Discussed with pharmacy, at this point think the best choice for this patient given a multitude of factors is to resume Xarelto as recommended by his primary care physician.  #5: Bilateral lower extremity venous stasis changes with chronic wounds, Multiple wounds, deep tissue injury to the left heel present on admission, incontinence associated dermatitis, venous insufficiency. Per wound care: Cleanse bilateral lower legs with soap and water and pat dry. Cover wounds with Xeroform gauze, ABD pad. Wrap with kerlix from base of toes to below knee and cover with ace wrap for minimal compression. Cleanse perineal skin with soap and water and apply barrier cream twice daily.  #6: diabetes mellitus type 2,  Remains stable. Not a candidate for metformin given his low ejection fraction. -Blood sugars have remained well controlled with minimal need for insulin. Will proceed with monotherapy with glipizide on discharge.  Morbid obesity  Cigarette smoker -Recommend cessation  Iron deficiency anemia, stable. Follow-up as an outpatient.  Noncompliance, poor hygiene, self-neglect. Seen on most recent hospitalization,  documented by outpatient primary care physician as well as nursing during this hospitalization.   Today's assessment:  See same day progress note  Discharge Instructions  Discharge Instructions    Diet - low sodium heart healthy    Complete by:  As directed    Diet Carb Modified    Complete by:  As directed    Discharge instructions    Complete by:  As directed    Call hospice, your physician or seek immediate medical attention for shortness of breath, pain, confusion or worsening of condition.   Increase activity slowly    Complete by:  As directed      Allergies as of 12/27/2016      Reactions   Meloxicam Other (See Comments)   Other reaction(s): Unknown UNKNOWN   Penicillins Hives   Has patient had a PCN reaction causing immediate rash, facial/tongue/throat swelling, SOB or lightheadedness with hypotension: no Has patient had a PCN reaction causing severe rash involving mucus membranes or skin necrosis: no Has patient had a PCN reaction that required hospitalization: no Has patient had a PCN reaction occurring within the last 10 years: no If all of the above answers are "NO", then may proceed with Cephalosporin use.      Medication List    STOP taking these medications   metFORMIN 500 MG 24 hr tablet Commonly known as:  GLUCOPHAGE-XR     TAKE these medications   atorvastatin 20 MG tablet Commonly known as:  LIPITOR Take 20 mg by mouth daily at 6 PM.   budesonide-formoterol 160-4.5 MCG/ACT inhaler Commonly known as:  SYMBICORT Inhale 2 puffs into the lungs 2 (two) times daily.   DRY EYES OP Apply 1 drop to eye daily as needed (dry eyes).   ferrous sulfate 325 (65 FE) MG tablet Take 1 tablet (325  mg total) by mouth 2 (two) times daily with a meal.   furosemide 40 MG tablet Commonly known as:  LASIX Take 1 tablet (40 mg total) by mouth 2 (two) times daily. What changed:  when to take this   glipiZIDE 10 MG 24 hr tablet Commonly known as:  GLUCOTROL XL Take  1 tablet by mouth daily.   HYDROcodone-acetaminophen 5-325 MG tablet Commonly known as:  NORCO/VICODIN Take 1-2 tablets by mouth every 6 (six) hours as needed for moderate pain.   ipratropium-albuterol 0.5-2.5 (3) MG/3ML Soln Commonly known as:  DUONEB Take 3 mLs by nebulization every 6 (six) hours as needed.   metolazone 5 MG tablet Commonly known as:  ZAROXOLYN Take 1 tablet by mouth 3 (three) times a week. Take on Monday, Wednesday, and Friday.   metoprolol succinate 25 MG 24 hr tablet Commonly known as:  TOPROL-XL Take 0.5 tablets (12.5 mg total) by mouth daily.   omeprazole 20 MG capsule Commonly known as:  PRILOSEC Take 1 capsule by mouth daily.   potassium chloride 10 MEQ tablet Commonly known as:  K-DUR Take 10 mEq by mouth daily.   XARELTO 20 MG Tabs tablet Generic drug:  rivaroxaban Take 20 mg by mouth daily.      Allergies  Allergen Reactions  . Meloxicam Other (See Comments)    Other reaction(s): Unknown UNKNOWN  . Penicillins Hives    Has patient had a PCN reaction causing immediate rash, facial/tongue/throat swelling, SOB or lightheadedness with hypotension: no Has patient had a PCN reaction causing severe rash involving mucus membranes or skin necrosis: no Has patient had a PCN reaction that required hospitalization: no Has patient had a PCN reaction occurring within the last 10 years: no If all of the above answers are "NO", then may proceed with Cephalosporin use.     The results of significant diagnostics from this hospitalization (including imaging, microbiology, ancillary and laboratory) are listed below for reference.    Significant Diagnostic Studies: Dg Chest 1 View  Result Date: 12/23/2016 CLINICAL DATA:  Lower extremity swelling EXAM: CHEST 1 VIEW COMPARISON:  09/27/2016 FINDINGS: Cardiomegaly. Scarring in the right lung base. No confluent acute airspace opacities or effusions. No edema. No acute bony abnormality. IMPRESSION:  Cardiomegaly/chronic changes.  No active disease. Electronically Signed   By: Rolm Baptise M.D.   On: 12/23/2016 16:33    Microbiology: Recent Results (from the past 240 hour(s))  MRSA PCR Screening     Status: None   Collection Time: 12/23/16 10:00 PM  Result Value Ref Range Status   MRSA by PCR NEGATIVE NEGATIVE Final    Comment:        The GeneXpert MRSA Assay (FDA approved for NASAL specimens only), is one component of a comprehensive MRSA colonization surveillance program. It is not intended to diagnose MRSA infection nor to guide or monitor treatment for MRSA infections.   Culture, blood (routine x 2)     Status: None (Preliminary result)   Collection Time: 12/24/16 12:33 AM  Result Value Ref Range Status   Specimen Description BLOOD LEFT ARM  Final   Special Requests   Final    BOTTLES DRAWN AEROBIC AND ANAEROBIC Blood Culture adequate volume   Culture NO GROWTH 3 DAYS  Final   Report Status PENDING  Incomplete  Culture, blood (routine x 2)     Status: None (Preliminary result)   Collection Time: 12/24/16 12:45 AM  Result Value Ref Range Status   Specimen Description BLOOD RIGHT HAND  Final   Special Requests   Final    BOTTLES DRAWN AEROBIC AND ANAEROBIC Blood Culture adequate volume   Culture NO GROWTH 3 DAYS  Final   Report Status PENDING  Incomplete     Labs: Basic Metabolic Panel:  Recent Labs Lab 12/23/16 1540 12/24/16 0428 12/24/16 0917 12/25/16 0601 12/26/16 0616  NA 137 136  --  133* 128*  K 3.2* 2.5*  --  3.2* 3.3*  CL 92* 87*  --  86* 83*  CO2 34* 36*  --  35* 37*  GLUCOSE 122* 125*  --  146* 142*  BUN 22* 22*  --  24* 30*  CREATININE 0.94 0.94  --  0.91 1.08  CALCIUM 9.1 8.8*  --  8.4* 8.5*  MG  --   --  1.5*  --   --    Liver Function Tests:  Recent Labs Lab 12/23/16 1540  AST 18  ALT 12*  ALKPHOS 117  BILITOT 0.7  PROT 8.1  ALBUMIN 3.3*   CBC:  Recent Labs Lab 12/23/16 1540 12/24/16 0428 12/25/16 0601 12/27/16 0453    WBC 8.7 8.7 9.4 15.9*  NEUTROABS 6.3  --   --   --   HGB 10.9* 10.6* 10.5* 10.5*  HCT 36.1* 35.9* 35.4* 34.2*  MCV 74.0* 74.8* 73.6* 72.8*  PLT 290 307 292 257   Cardiac Enzymes:  Recent Labs Lab 12/23/16 1540 12/23/16 2102 12/24/16 0427 12/24/16 0917  TROPONINI 0.03* 0.03* <0.03 <0.03     Recent Labs  12/04/16 1053 12/23/16 1541  BNP 345.0* 441.0*    CBG:  Recent Labs Lab 12/26/16 1141 12/26/16 1603 12/26/16 2107 12/27/16 0804 12/27/16 1124  GLUCAP 158* 123* 156* 134* 107*    Principal Problem:   Atrial fibrillation with RVR (HCC) Active Problems:   DVT (deep venous thrombosis) (HCC)   DM type 2 (diabetes mellitus, type 2) (HCC)   Stasis dermatitis of both legs   Tobacco dependence   Iron deficiency anemia   Acute systolic CHF (congestive heart failure) (HCC)   Goals of care, counseling/discussion   Palliative care encounter   Encounter for hospice care discussion   Time coordinating discharge: 50 minutes  Signed:  Murray Hodgkins, MD [facility] 12/27/2016, 3:51 PM

## 2016-12-27 NOTE — Progress Notes (Signed)
PT Cancellation Note  Patient Details Name: Keith Kelly MRN: [id number] DOB: [birth year]   Cancelled Treatment:    Reason Eval/Treat Not Completed: Patient not medically ready.  Held per nsg, may DC today with hospice and will evaluate him if appropriate for this.   Ramond Dial 12/27/2016, 1:50 PM   1:51 PM, 12/27/16 Mee Hives, PT, MS Physical Therapist - [place] [phone] (Office)

## 2016-12-27 NOTE — Consult Note (Signed)
Consult requested by: [facility], Dr. Sarajane Jews Consult requested for: COPD exacerbation, acute on chronic respiratory failure  HPI: This is a 61 year old who has significant history of congestive heart failure, COPD, morbid obesity, personal history of DVT and pulmonary embolism atrial fibrillation rapid ventricular response diabetes previous heart attack and previous history of head and neck cancer. He has been being treated for COPD exacerbation and his atrial fib. He has wounds on his legs are being treated as well. He says he feels okay. He has difficulty hearing and his vision is not good. He denies any chest pain now. He is still short of breath. He's coughing of sputum. He still complains of pain in his legs. He says at home he is able to move around some but he generally uses a cane or a wheelchair. He is short of breath with exertion. He has a positive history of COPD and of clotting abnormalities.  Past Medical History:  Diagnosis Date  . Asthma   . Atrial fibrillation ([place])   . Blood in urine   . Cancer ([place])   . Cellulitis of right lower extremity   . COPD (chronic obstructive pulmonary disease) ([place])   . Diabetes mellitus ([place])   . 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 MI (myocardial infarction) ([place])   . Peripheral edema   . Right leg DVT (HCC)      Family History  Problem Relation Age of Onset  . COPD Father   . Deep vein thrombosis Mother      Social History   Social History  . Marital status: Divorced    Spouse name: N/A  . Number of children: N/A  . Years of education: N/A   Occupational History  . disabled    Social History Main Topics  . Smoking status: Current Every Day Smoker    Packs/day: 0.50    Years: 45.00    Types: Cigarettes    Start date: 04/07/1972  . Smokeless tobacco: Never Used  . Alcohol use No  . Drug use: No  . Sexual activity: Not Asked   Other Topics Concern  . None   Social  History Narrative  . None     ROS: Except as mentioned 10 point review of systems is negative    Objective: Vital signs in last 24 hours: Temp:  [97.5 F (36.4 C)-98.6 F (37 C)] 98.6 F (37 C) (07/05 0400) Pulse Rate:  [85-218] 105 (07/05 0500) Resp:  [16-32] 20 (07/05 0500) BP: (79-120)/(58-103) 92/80 (07/05 0500) SpO2:  [79 %-99 %] 94 % (07/05 0750) FiO2 (%):  [55 %] 55 % (07/04 2110) Weight:  [142.1 kg (313 lb 4.4 oz)] 142.1 kg (313 lb 4.4 oz) (07/05 0500) Weight change: 1 kg (2 lb 3.3 oz) Last BM Date: 12/23/16  Intake/Output from previous day: 07/04 0701 - 07/05 0700 In: -  Out: 2400 [Urine:2400]  PHYSICAL EXAM Constitutional: He is awake and alert and in no acute distress. Eyes: Pupils react. EOMI. Ears nose mouth and throat: He has facial asymmetry that I think is probably related to previous surgery. He has multiple missing teeth. He is very hard of hearing. Cardiovascular: He is in atrial fib with well-controlled ventricular response now. He has systolic heart murmur. He has bilateral lower leg edema. Respiratory: His respiratory effort is normal. His lungs show bilateral rhonchi. Gastrointestinal: His abdomen is soft with no masses. Bowel sounds are present and active. Skin: He has dressings on  his leg wounds and I did not remove those dressings at this time. Musculoskeletal: He is clearly weak in his legs. Neurological: As mentioned he has some facial asymmetry I believe is a result of his previous surgery for head and neck cancer. Psychiatric: Normal mood and affect  Lab Results: Basic Metabolic Panel:  Recent Labs  12/24/16 0917 12/25/16 0601 12/26/16 0616  NA  --  133* 128*  K  --  3.2* 3.3*  CL  --  86* 83*  CO2  --  35* 37*  GLUCOSE  --  146* 142*  BUN  --  24* 30*  CREATININE  --  0.91 1.08  CALCIUM  --  8.4* 8.5*  MG 1.5*  --   --    Liver Function Tests: No results for input(s): AST, ALT, ALKPHOS, BILITOT, PROT, ALBUMIN in the last 72 hours. No  results for input(s): LIPASE, AMYLASE in the last 72 hours. No results for input(s): AMMONIA in the last 72 hours. CBC:  Recent Labs  12/25/16 0601 12/27/16 0453  WBC 9.4 15.9*  HGB 10.5* 10.5*  HCT 35.4* 34.2*  MCV 73.6* 72.8*  PLT 292 257   Cardiac Enzymes:  Recent Labs  12/24/16 0917  TROPONINI <0.03   BNP: No results for input(s): PROBNP in the last 72 hours. D-Dimer: No results for input(s): DDIMER in the last 72 hours. CBG:  Recent Labs  12/25/16 2126 12/26/16 0732 12/26/16 1141 12/26/16 1603 12/26/16 2107 12/27/16 0804  GLUCAP 154* 143* 158* 123* 156* 134*   Hemoglobin A1C: No results for input(s): HGBA1C in the last 72 hours. Fasting Lipid Panel: No results for input(s): CHOL, HDL, LDLCALC, TRIG, CHOLHDL, LDLDIRECT in the last 72 hours. Thyroid Function Tests: No results for input(s): TSH, T4TOTAL, FREET4, T3FREE, THYROIDAB in the last 72 hours. Anemia Panel: No results for input(s): VITAMINB12, FOLATE, FERRITIN, TIBC, IRON, RETICCTPCT in the last 72 hours. Coagulation:  Recent Labs  12/27/16 0453  LABPROT 16.4*  INR 1.31   Urine Drug Screen: Drugs of Abuse     Component Value Date/Time   LABOPIA POSITIVE (A) 12/24/2016 0630   COCAINSCRNUR NONE DETECTED 12/24/2016 0630   LABBENZ NONE DETECTED 12/24/2016 0630   AMPHETMU NONE DETECTED 12/24/2016 0630   THCU NONE DETECTED 12/24/2016 0630   LABBARB NONE DETECTED 12/24/2016 0630    Alcohol Level: No results for input(s): ETH in the last 72 hours. Urinalysis: No results for input(s): COLORURINE, LABSPEC, PHURINE, GLUCOSEU, HGBUR, BILIRUBINUR, KETONESUR, PROTEINUR, UROBILINOGEN, NITRITE, LEUKOCYTESUR in the last 72 hours.  Invalid input(s): APPERANCEUR Misc. Labs:   ABGS: No results for input(s): PHART, PO2ART, TCO2, HCO3 in the last 72 hours.  Invalid input(s): PCO2   MICROBIOLOGY: Recent Results (from the past 240 hour(s))  MRSA PCR Screening     Status: None   Collection Time:  12/23/16 10:00 PM  Result Value Ref Range Status   MRSA by PCR NEGATIVE NEGATIVE Final    Comment:        The GeneXpert MRSA Assay (FDA approved for NASAL specimens only), is one component of a comprehensive MRSA colonization surveillance program. It is not intended to diagnose MRSA infection nor to guide or monitor treatment for MRSA infections.   Culture, blood (routine x 2)     Status: None (Preliminary result)   Collection Time: 12/24/16 12:33 AM  Result Value Ref Range Status   Specimen Description BLOOD LEFT ARM  Final   Special Requests   Final    BOTTLES DRAWN AEROBIC  AND ANAEROBIC Blood Culture adequate volume   Culture NO GROWTH 3 DAYS  Final   Report Status PENDING  Incomplete  Culture, blood (routine x 2)     Status: None (Preliminary result)   Collection Time: 12/24/16 12:45 AM  Result Value Ref Range Status   Specimen Description BLOOD RIGHT HAND  Final   Special Requests   Final    BOTTLES DRAWN AEROBIC AND ANAEROBIC Blood Culture adequate volume   Culture NO GROWTH 3 DAYS  Final   Report Status PENDING  Incomplete    Studies/Results: No results found.  Medications:  Prior to Admission:  Prescriptions Prior to Admission  Medication Sig Dispense Refill Last Dose  . Artificial Tear Ointment (DRY EYES OP) Apply 1 drop to eye daily as needed (dry eyes).   Past Week at Unknown time  . atorvastatin (LIPITOR) 20 MG tablet Take 20 mg by mouth daily at 6 PM.   12/22/2016 at 1900  . budesonide-formoterol (SYMBICORT) 160-4.5 MCG/ACT inhaler Inhale 2 puffs into the lungs 2 (two) times daily.   12/22/2016 at Unknown time  . furosemide (LASIX) 40 MG tablet Take 40 mg by mouth daily.   12/23/2016 at 0830  . glipiZIDE (GLUCOTROL XL) 10 MG 24 hr tablet Take 1 tablet by mouth daily.  2 12/23/2016 at 0830  . HYDROcodone-acetaminophen (NORCO/VICODIN) 5-325 MG per tablet Take 1-2 tablets by mouth every 6 (six) hours as needed for moderate pain.   12/23/2016 at 0830  . metFORMIN  (GLUCOPHAGE-XR) 500 MG 24 hr tablet Take 1 tablet by mouth 2 (two) times daily.  3 12/23/2016 at 0830  . omeprazole (PRILOSEC) 20 MG capsule Take 1 capsule by mouth daily.   12/22/2016 at Unknown time  . potassium chloride (K-DUR) 10 MEQ tablet Take 10 mEq by mouth daily.   12/23/2016 at 0830  . rivaroxaban (XARELTO) 20 MG TABS tablet Take 20 mg by mouth daily.   12/23/2016 at 0830  . metolazone (ZAROXOLYN) 5 MG tablet Take 1 tablet by mouth 3 (three) times a week. Take on Monday, Wednesday, and Friday.  3 12/21/2016   Scheduled: . aspirin  81 mg Oral Daily  . atorvastatin  20 mg Oral q1800  . enoxaparin (LOVENOX) injection  140 mg Subcutaneous Q12H  . ferrous sulfate  325 mg Oral BID WC  . furosemide  40 mg Intravenous Q12H  . insulin aspart  0-15 Units Subcutaneous TID WC  . lisinopril  10 mg Oral Daily  . metolazone  5 mg Oral Once per day on Mon Wed Fri  . metoprolol tartrate  5 mg Intravenous Q6H  . mometasone-formoterol  2 puff Inhalation BID  . nicotine  14 mg Transdermal Daily  . pantoprazole  40 mg Oral Daily  . potassium chloride  10 mEq Oral Daily  . sodium chloride flush  3 mL Intravenous Q12H  . triamcinolone 0.1 % cream : eucerin   Topical BID  . warfarin   Does not apply Once  . Warfarin - Pharmacist Dosing Inpatient   Does not apply Q24H   Continuous: . sodium chloride     QBH:ALPFXT chloride, acetaminophen, ipratropium-albuterol, ondansetron (ZOFRAN) IV, oxyCODONE-acetaminophen **AND** [DISCONTINUED] oxyCODONE, sodium chloride flush  Assesment: He was admitted with atrial fibrillation with rapid ventricular response. He has COPD exacerbation as well. He had acute congestive heart failure. He has history of DVT and pulmonary emboli. At home he takes Symbicort. If he's going to use a nebulizer at home with ipratropium I would have him  use that regularly and if he wants to rather use a when necessary nebulizer I would have him use albuterol or Xopenex on a when necessary basis and  add something like Spiriva Principal Problem:   Atrial fibrillation with RVR ([place]) Active Problems:   DVT (deep venous thrombosis) ([place])   DM type 2 (diabetes mellitus, type 2) (HCC)   Stasis dermatitis of both legs   Tobacco dependence   Iron deficiency anemia   Acute systolic CHF (congestive heart failure) ([place])   Goals of care, counseling/discussion   Palliative care encounter    Plan: As above. I have not seen him before but he seems relatively stable now with his COPD. He will need outpatient follow-up. I did not add steroids but will discuss with hospitalist attending    LOS: 3 days   Shawndrea Rutkowski L 12/27/2016, 8:20 AM

## 2016-12-27 NOTE — Care Management (Signed)
Keith Kelly -[phone]

## 2016-12-27 NOTE — Care Management (Signed)
[facility] to deliver oxygen and BiPap to patient's home between 4-5 pm. Darlene (patient's ex wife) states someone will be at the home to receive equipment. CM asked Darlene to let RN or CM know when equipment has been received and EMS transport will be arranged.  Darlene asks that patient's wallet and clothes be sent home with him. RN updated to send items with patient.

## 2016-12-27 NOTE — Care Management (Signed)
CM received call from [facility], referral received, they are working on HF oxygen and Bipap for patient at home.

## 2016-12-27 NOTE — Care Management (Signed)
Per Palliative NP patient wants to go home with hospice services. Would like [facility]. Patient will need oxygen and Bipap at home. Referral faxed out to [facility], will await return call.

## 2016-12-27 NOTE — Progress Notes (Signed)
Pt is being transported home at this time (Time out 2017) In care of [facility]. Pt is awake and conversing at this time. Personal belongings( wallet, clothes, with pt.)  Called family about wheelchair as EMS is unable to transport it. Family will get this device as soon as they can.Carlyon Shadow)

## 2016-12-27 NOTE — Care Management (Signed)
CM spoke with Juanda Crumble of [facility], equipment has been ordered. CM attempted to talk with patient's fiance about delivery of equipment. Fiance is not in patient's room and patient is unsure if she has left for the day. CM attempted to call # listed on chart for fiance, # is not working. Per RN, fiance reported not having any more minutes on her phone.  CM will await for fiance to return to Hospital, RN aware.

## 2016-12-27 NOTE — Progress Notes (Signed)
Made several attempts to contact staff education line ext [phone] to order Warfarin video for patient to watch as ordered w/o success. MD made aware.

## 2016-12-27 NOTE — Progress Notes (Signed)
Daily Progress Note   Patient Name: Keith Kelly       Date: 12/27/2016 DOB: 02-16-1956  Age: 61 y.o. MRN#: [id number] Attending Physician: Keith Cota, MD Primary Care Physician: Keith Housekeeper, MD Admit Date: 12/23/2016  Reason for Consultation/Follow-up: Establishing goals of care and Psychosocial/spiritual support  Subjective: Keith Kelly is sitting up at the edge of the bed. He looks much erection but does not make eye contact because his vision is poor. Present today at bedside is fiance Keith Kelly. Keith Kelly states his goal is to return home as soon as possible. He states that he feels better and his preference is to be at home. We talk about the use of BiPAP improving how he feels with his oxygenation and fluid in his lungs.  We talk again today about the benefits of hospice including RN, CNA, social worker available to help with his needs, all at no cost. We talk the use of PRN medications for breathing and pain. I share that hospice will respect Keith Kelly's wishes, "do things his way". I share again that we cannot change his medical prognosis, but can be sure that things look the way he wants them to. Both Keith Kelly and fiance Keith Kelly agree to hospice. They are requesting [facility] for in-home services. Keith Kelly states his desire is to return to his home today if possible.  Length of Stay: 3  Current Medications: Scheduled Meds:  . aspirin  81 mg Oral Daily  . atorvastatin  20 mg Oral q1800  . enoxaparin (LOVENOX) injection  140 mg Subcutaneous Q12H  . ferrous sulfate  325 mg Oral BID WC  . furosemide  40 mg Intravenous Q12H  . insulin aspart  0-15 Units Subcutaneous TID WC  . lisinopril  10 mg Oral Daily  . metolazone  5 mg Oral Once per day on Mon Wed Fri    . metoprolol tartrate  5 mg Intravenous Q6H  . mometasone-formoterol  2 puff Inhalation BID  . nicotine  14 mg Transdermal Daily  . pantoprazole  40 mg Oral Daily  . potassium chloride  10 mEq Oral Daily  . sodium chloride flush  3 mL Intravenous Q12H  . triamcinolone 0.1 % cream : eucerin   Topical BID  . warfarin   Does not apply Once  .  Warfarin - Pharmacist Dosing Inpatient   Does not apply Q24H    Continuous Infusions: . sodium chloride      PRN Meds: sodium chloride, acetaminophen, ipratropium-albuterol, ondansetron (ZOFRAN) IV, oxyCODONE-acetaminophen **AND** [DISCONTINUED] oxyCODONE, sodium chloride flush  Physical Exam  Constitutional: He is oriented to person, place, and time. No distress.  Poor vision and hearing, calm and cooperative  HENT:  Head: Atraumatic.  Cardiovascular: Normal rate.   Pulmonary/Chest: Effort normal. No respiratory distress.  Hi flow nasal cannula  Abdominal: Soft. He exhibits distension.  Musculoskeletal: He exhibits edema.  Neurological: He is alert and oriented to person, place, and time.  Skin: Skin is warm and dry.  Psychiatric: Judgment and thought content normal.  Nursing note and vitals reviewed.           Vital Signs: BP 90/67   Pulse (!) 193   Temp 98.3 F (36.8 C) (Oral)   Resp (!) 24   Ht 6\' 3"  (1.905 m)   Wt (!) 142.1 kg (313 lb 4.4 oz)   SpO2 98%   BMI 39.16 kg/m  SpO2: SpO2: 98 % O2 Device: O2 Device: High Flow Nasal Cannula O2 Flow Rate: O2 Flow Rate (L/min): 9 L/min  Intake/output summary:  Intake/Output Summary (Last 24 hours) at 12/27/16 1107 Last data filed at 12/27/16 0500  Gross per 24 hour  Intake                0 ml  Output             2400 ml  Net            -2400 ml   LBM: Last BM Date: 12/23/16 Baseline Weight: Weight: (!) 142.4 kg (314 lb) Most recent weight: Weight: (!) 142.1 kg (313 lb 4.4 oz)       Palliative Assessment/Data:    Flowsheet Rows     Most Recent Value  Intake Tab   Referral Department  Hospitalist  Unit at Time of Referral  ICU  Palliative Care Primary Diagnosis  Cardiac  Date Notified  12/26/16  Palliative Care Type  New Palliative care  Reason for referral  Clarify Goals of Care  Date of Admission  12/23/16  Date first seen by Palliative Care  12/26/16  # of days Palliative referral response time  0 Day(s)  # of days IP prior to Palliative referral  3  Clinical Assessment  Palliative Performance Scale Score  30%  Pain Max last 24 hours  Not able to report  Pain Min Last 24 hours  Not able to report  Dyspnea Max Last 24 Hours  Not able to report  Dyspnea Min Last 24 hours  Not able to report  Psychosocial & Spiritual Assessment  Palliative Care Outcomes  Patient/Family meeting held?  Yes  Who was at the meeting?  Patient only today  Palliative Care Outcomes  Clarified goals of care, Provided psychosocial or spiritual support  Patient/Family wishes: Interventions discontinued/not started   Mechanical Ventilation      Patient Active Problem List   Diagnosis Date Noted  . Goals of care, counseling/discussion   . Palliative care encounter   . Acute systolic CHF (congestive heart failure) ([place]) 12/24/2016  . Stasis dermatitis of both legs 12/23/2016  . Tobacco dependence 12/23/2016  . Iron deficiency anemia 12/23/2016  . Atrial fibrillation with RVR ([place]) 12/04/2016  . Acute CHF ([place]) 12/04/2016  . DM type 2 (diabetes mellitus, type 2) ([place]) 12/04/2016  . Morbid obesity ([place]) 12/04/2016  .  Pressure injury of skin 12/04/2016  . Abnormal ECG 05/06/2014  . DVT (deep venous thrombosis) ([place]) 05/06/2014  . COPD (chronic obstructive pulmonary disease) ([place]) 05/06/2014  . Throat cancer ([place]) 05/06/2014  . Left ventricular hypertrophy 05/06/2014    Palliative Care Assessment & Plan   Patient Profile: 61 y.o. male  with past medical history of CHF (EFF of 20 to 25% on 7/2), COPD, morbid obesity, multiple DVT/PE with recent admissions 6/12  through 13 for acute on chronic heart failure and a fib with RVR (he left AMA), history of face and neck cancer removed approximately 5 years ago admitted on 12/23/2016 with a fib with RVR, stasis dermatitis and chronic bilateral lower extremity wounds.   Assessment: End-stage CHF; Keith Kelly is maxed out with medical management of his heart failure. He has several exacerbations over the last few months, and is now oxygen dependent, requiring PRN BiPAP. end-stage COPD; Keith Kelly is now oxygen dependent, he is next out on medical management of COPD, and feels that he can manage his medications and treatments as he desires.  Recommendations/Plan:  home with benefit of [facility].  Goals of Care and Additional Recommendations:  Limitations on Scope of Treatment: Treat the treatable but no extraordinary measures, home with hospice  Code Status:    Code Status Orders        Start     Ordered   12/23/16 2041  Do not attempt resuscitation (DNR)  Continuous    Question Answer Comment  In the event of cardiac or respiratory ARREST Do not call a "code blue"   In the event of cardiac or respiratory ARREST Do not perform Intubation, CPR, defibrillation or ACLS   In the event of cardiac or respiratory ARREST Use medication by any route, position, wound care, and other measures to relive pain and suffering. May use oxygen, suction and manual treatment of airway obstruction as needed for comfort.      12/23/16 2040    Code Status History    Date Active Date Inactive Code Status Order ID Comments User Context   12/04/2016  5:10 PM 12/05/2016  1:27 PM Full Code [id number]  Keith Cota, MD Inpatient       Prognosis:   < 6 months and would not be surprising based on functional decline, recurrent hospitalizations, continued smoking, chronic leg wounds, EF of 20 to 25%  Discharge Planning:  Home with the benefit of [facility].   Care plan was discussed with nursing staff,  case manager, social worker, and Dr. Sarajane Jews  Thank you for allowing the Palliative Medicine Team to assist in the care of this patient.   Time In: 0920 Time Out: 0955 Total Time 35 minutes Prolonged Time Billed  no       Greater than 50%  of this time was spent counseling and coordinating care related to the above assessment and plan.  Drue Novel, NP  Please contact Palliative Medicine Team phone at [phone] for questions and concerns.

## 2016-12-27 NOTE — Care Management (Addendum)
Patient Information  SS# [id number] PCP : Dione Housekeeper  Patient Name Rickard, Kennerly ([id number]) Sex Male DOB [birth year]  Room Bed  IC11 IC11-01  Patient Demographics   Address [address] [place] [address] Phone [phone] (Home)  Patient Ethnicity & Race   Ethnic Group Patient Race  Not Hispanic or Latino White or Caucasian  Emergency Contact(s)   Name Relation Home Work [place] Other [phone]    Documents on File    Status Date Received Description  Documents for the Patient  [place] [facility] Documentation Not Received    [place] Not Received    [place] E-Signature HIPAA Notice of Privacy Received 35/32/99   Driver's License Not Received    Insurance Card Not Received    Advance Directives/Living Will/HCPOA/POA Not Received    HIM ROI Authorization Not Received  02/29/12 - ER visit.  Release of Information Not Received    Insurance Card Received 05/06/14 MCD/[facility]/CARD  AMB Correspondence  05/19/14 REFERRAL [facility]  Other Photo ID Not Received    AMB Outside Hospital Record  12/06/16 OFFICE VISIT [facility]  HIM ROI Authorization  12/25/16 [facility] DSS  Patient Photo   Photo of Patient  Documents for the Encounter  AOB (Assignment of Insurance Benefits) Not Received    E-signature AOB Signed 12/23/16   MEDICARE RIGHTS Not Received    E-signature Medicare Rights     ED Patient Billing Extract   ED PB Summary  ED Patient Billing Extract   ED Encounter Summary  Cardiac Monitoring Strip Shift Summary  12/24/16   Cardiac Monitoring Strip  12/24/16   EKG  12/24/16   Admission Information   Attending Provider Admitting Provider Admission Type Admission Date/Time  Samuella Cota, MD Karmen Bongo, MD Emergency 12/23/16 1513  Discharge Date Hospital Service Auth/Cert Status Service Area   Internal Medicine Incomplete [place]  Unit Room/Bed Admission Status   AP-ICCUP NURSING IC11/IC11-01 Admission (Confirmed)   Admission   Complaint  [facility] Account   Name Acct ID Class Status Primary Coverage  Per, [place] [id number] Inpatient Open MEDICAID [place]      Guarantor Account (for Hospital Account [id number])   Name Relation to Pt Service Area Active? Acct Type  [name] Self [facility] Yes Personal/Family  Address Phone    [place], [place] [address] [phone](H)        Coverage Information (for Hospital Account [id number])   F/O Payor/Plan Precert #  MEDICAID [place]/MEDICAID [place] ACCESS   Subscriber Subscriber #  [place], [place] [id number]  Address Phone  [address] [place], [place] [address]

## 2016-12-27 NOTE — Progress Notes (Signed)
Scant amount of drainage noted to venous ulcer wounds to BLE, contained & ACE bandages noted dry & intact. Contact precautions d/c.

## 2016-12-29 LAB — CULTURE, BLOOD (ROUTINE X 2)
Culture: NO GROWTH
Culture: NO GROWTH
SPECIAL REQUESTS: ADEQUATE
SPECIAL REQUESTS: ADEQUATE

## 2017-01-03 ENCOUNTER — Telehealth: Payer: Self-pay | Admitting: Cardiovascular Disease

## 2017-01-03 ENCOUNTER — Other Ambulatory Visit: Payer: Medicaid Other

## 2017-01-03 NOTE — Telephone Encounter (Signed)
Patient would like to speak with someone regarding patient's condition. / tg

## 2017-01-03 NOTE — Telephone Encounter (Signed)
Pt wanted [place] to know that echo that was scheduled in [place] was done in-patient

## 2017-04-25 DEATH — deceased
